# Patient Record
Sex: Female | Born: 1960
Health system: Southern US, Community
[De-identification: ages and names within clinical notes are randomized; demographics above are authoritative.]

## PROBLEM LIST (undated history)

## (undated) DIAGNOSIS — R87613 High grade squamous intraepithelial lesion on cytologic smear of cervix (HGSIL): Secondary | ICD-10-CM

## (undated) DIAGNOSIS — I38 Endocarditis, valve unspecified: Secondary | ICD-10-CM

## (undated) DIAGNOSIS — E0500 Thyrotoxicosis with diffuse goiter without thyrotoxic crisis or storm: Secondary | ICD-10-CM

## (undated) DIAGNOSIS — E039 Hypothyroidism, unspecified: Secondary | ICD-10-CM

## (undated) DIAGNOSIS — R079 Chest pain, unspecified: Secondary | ICD-10-CM

## (undated) DIAGNOSIS — F17200 Nicotine dependence, unspecified, uncomplicated: Secondary | ICD-10-CM

## (undated) DIAGNOSIS — R87629 Unspecified abnormal cytological findings in specimens from vagina: Secondary | ICD-10-CM

## (undated) DIAGNOSIS — N871 Moderate cervical dysplasia: Secondary | ICD-10-CM

## (undated) DIAGNOSIS — D069 Carcinoma in situ of cervix, unspecified: Secondary | ICD-10-CM

## (undated) HISTORY — PX: BACK SURGERY: SHX140

## (undated) HISTORY — PX: COLONOSCOPY: SHX174

## (undated) HISTORY — DX: Unspecified abnormal cytological findings in specimens from vagina: R87.629

## (undated) HISTORY — PX: OTHER SURGICAL HISTORY: SHX169

## (undated) HISTORY — PX: BREAST SURGERY: SHX581

## (undated) HISTORY — DX: Hypothyroidism, unspecified: E03.9

## (undated) HISTORY — PX: ESOPHAGOGASTRODUODENOSCOPY: SHX1529

## (undated) HISTORY — DX: Chest pain, unspecified: R07.9

---
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
## 2000-12-27 ENCOUNTER — Ambulatory Visit (HOSPITAL_COMMUNITY): Admission: RE | Admit: 2000-12-27 | Discharge: 2000-12-27 | Payer: Self-pay | Admitting: Family Medicine

## 2000-12-27 ENCOUNTER — Encounter: Payer: Self-pay | Admitting: Family Medicine

## 2000-12-28 ENCOUNTER — Encounter: Payer: Self-pay | Admitting: Family Medicine

## 2000-12-28 ENCOUNTER — Ambulatory Visit (HOSPITAL_COMMUNITY): Admission: RE | Admit: 2000-12-28 | Discharge: 2000-12-28 | Payer: Self-pay | Admitting: Family Medicine

## 2000-12-31 ENCOUNTER — Ambulatory Visit (HOSPITAL_COMMUNITY): Admission: RE | Admit: 2000-12-31 | Discharge: 2000-12-31 | Payer: Self-pay | Admitting: *Deleted

## 2000-12-31 HISTORY — PX: CARDIAC CATHETERIZATION: SHX172

## 2001-09-07 ENCOUNTER — Other Ambulatory Visit: Admission: RE | Admit: 2001-09-07 | Discharge: 2001-09-07 | Payer: Self-pay | Admitting: Unknown Physician Specialty

## 2001-09-08 ENCOUNTER — Ambulatory Visit (HOSPITAL_COMMUNITY): Admission: RE | Admit: 2001-09-08 | Discharge: 2001-09-08 | Payer: Self-pay | Admitting: Family Medicine

## 2001-09-08 ENCOUNTER — Encounter: Payer: Self-pay | Admitting: Family Medicine

## 2001-09-22 ENCOUNTER — Ambulatory Visit (HOSPITAL_COMMUNITY): Admission: RE | Admit: 2001-09-22 | Discharge: 2001-09-22 | Payer: Self-pay | Admitting: Neurosurgery

## 2001-09-22 ENCOUNTER — Encounter: Payer: Self-pay | Admitting: Neurosurgery

## 2003-05-27 ENCOUNTER — Emergency Department (HOSPITAL_COMMUNITY): Admission: EM | Admit: 2003-05-27 | Discharge: 2003-05-28 | Payer: Self-pay | Admitting: Emergency Medicine

## 2003-08-08 ENCOUNTER — Ambulatory Visit (HOSPITAL_COMMUNITY): Admission: RE | Admit: 2003-08-08 | Discharge: 2003-08-08 | Payer: Self-pay | Admitting: Family Medicine

## 2003-09-07 ENCOUNTER — Encounter: Admission: RE | Admit: 2003-09-07 | Discharge: 2003-09-07 | Payer: Self-pay | Admitting: Neurosurgery

## 2004-01-18 ENCOUNTER — Ambulatory Visit (HOSPITAL_COMMUNITY): Admission: RE | Admit: 2004-01-18 | Discharge: 2004-01-18 | Payer: Self-pay | Admitting: Family Medicine

## 2004-09-15 ENCOUNTER — Ambulatory Visit (HOSPITAL_COMMUNITY): Admission: RE | Admit: 2004-09-15 | Discharge: 2004-09-15 | Payer: Self-pay | Admitting: Family Medicine

## 2004-10-28 ENCOUNTER — Inpatient Hospital Stay (HOSPITAL_COMMUNITY): Admission: RE | Admit: 2004-10-28 | Discharge: 2004-10-31 | Payer: Self-pay | Admitting: Neurosurgery

## 2005-04-17 ENCOUNTER — Ambulatory Visit: Payer: Self-pay | Admitting: Internal Medicine

## 2005-04-20 ENCOUNTER — Ambulatory Visit (HOSPITAL_COMMUNITY): Admission: RE | Admit: 2005-04-20 | Discharge: 2005-04-20 | Payer: Self-pay | Admitting: Internal Medicine

## 2005-04-20 ENCOUNTER — Ambulatory Visit: Payer: Self-pay | Admitting: Internal Medicine

## 2005-05-06 ENCOUNTER — Ambulatory Visit: Payer: Self-pay | Admitting: Internal Medicine

## 2005-05-06 ENCOUNTER — Ambulatory Visit (HOSPITAL_COMMUNITY): Admission: RE | Admit: 2005-05-06 | Discharge: 2005-05-06 | Payer: Self-pay | Admitting: Internal Medicine

## 2007-03-09 ENCOUNTER — Ambulatory Visit (HOSPITAL_COMMUNITY): Admission: RE | Admit: 2007-03-09 | Discharge: 2007-03-09 | Payer: Self-pay | Admitting: Family Medicine

## 2010-06-22 ENCOUNTER — Encounter: Payer: Self-pay | Admitting: Family Medicine

## 2010-10-17 NOTE — Cardiovascular Report (Signed)
. [facility]  Patient:    Kayla Cervantes, Kayla Cervantes                     MRN: [id number] Proc. Date: 12/31/00 Adm. Date:  12/31/00 Attending:  Sallye Lat, M.D. CC:         Jonell Cluck, M.D.  [facility]   Cardiac Catheterization  PROCEDURES PERFORMED: 1. Selective left ventriculography. 2. Selective right and left coronary arteriography. 3. Right femoral arteriography. 4. Closure of right femoral artery access site using Perclose.  INDICATIONS:  Ms. Ardelle Anton is a 50 year old female with past medical history of hypothyroidism, otherwise healthy.  She has been complaining of chest pain. She has a very strong family history of premature coronary artery disease in that her father had a myocardial infarction at the age of 5 and, also another brother died at a very young age with myocardial infarction.  Hence, she was directly brought to the cardiac catheterization laboratory to evaluate her coronary anatomy.  HEMODYNAMIC DATA: 1. The left ventricular pressures were 101, with an end-diastolic pressure    of 10 mmHg. 2. The central aortic pressures were 103/65, with a mean of 81 mmHg. 3. There is no pressure gradient across the left ventricle to aorta and    across the aortic valve.  LEFT VENTRICULOGRAM:  The left ventricular systolic function is well preserved.  The ejection fraction is estimated at 60%.  CORONARY ARTERIOGRAMS: 1. Left main:  The left main coronary artery is a large caliber vessel.  It    is disease free and bifurcated into the left anterior descending and    circumflex coronary arteries. 2. Left anterior descending:  The left anterior descending was a large caliber    vessel.  It is disease free.  It gives origin to a large diagonal #1 and    smaller diagonal #2 and diagonal #3 vessels. 3. Circumflex artery:  The circumflex artery is a nondominant vessel.  It ends    in the AV groove as a tiny  branch after giving origin to a large obtuse    marginal #1 branch.  They are disease free. 4. Right coronary artery:  The right coronary artery is a dominant vessel.  It    gives origin to PLV and PDA branches.  There is no disease in the right    coronary artery.  RIGHT FEMORAL ARTERIOGRAPHY:  This revealed good arterial access site.  IMPRESSIONS:  Normal coronary arteries.  RECOMMENDATIONS:  Smoking cessation is recommended.  Further workup for non-cardiac cause of chest pain may be indicated.  TECHNIQUE OF PROCEDURE:  Under the usual sterile precautions, using a 6-French right femoral artery access a 6-French multipurpose B2 catheter was advanced into the ascending aorta over a 0.035 mm J-wire.  The right coronary artery was selectively engaged and angiography was performed.  After obtaining adequate views, the catheter was then gently advanced into the left ventricle. Left ventriculography was performed both in LAO and RAO projections.  This was performed after obtaining the hemodynamic monitoring.  Then the catheter was exchanged to a 6-French Judkins left diagnostic catheter.  The left main coronary artery was selectively engaged and arteriography was performed. After obtaining adequate views, the catheter was pulled out of the body in the usual fashion.  Right femoral arteriography was performed in the RAO projection.  The right femoral artery access site was then closed using 6-French Perclose with excellent hemostasis achieved.  The patient  was transferred to the floor in stable condition. DD:  12/31/00 TD:  01/02/01 Job: [id number]

## 2010-10-17 NOTE — Op Note (Signed)
Kayla Cervantes, Kayla Cervantes               ACCOUNT NO.:  [id number]   MEDICAL RECORD NO.:  [id number]          PATIENT TYPE:  AMB   LOCATION:  DAY                           FACILITY:  [facility]   PHYSICIAN:  R. Roetta Sessions, M.D. DATE OF BIRTH:  07-02-1960   DATE OF PROCEDURE:  05/06/2005  DATE OF DISCHARGE:                                 OPERATIVE REPORT   PROCEDURE:  Diagnostic colonoscopy.   INDICATIONS FOR PROCEDURE:  A 50 year old lady with iron-deficiency anemia  and chronic diarrhea, abdominal pain. Colonoscopy is now being done to  further evaluate her symptoms. This approach has been discussed with the  patient at length. Potential risks, benefits, and alternatives have been  reviewed and questions answered.   PROCEDURE NOTE:  O2 saturation, blood pressure, pulse, and respirations were  monitored throughout the entire procedure. Conscious sedation with IV Versed  and Demerol in incremental doses.   INSTRUMENT:  Olympus video chip system.   FINDINGS:  Digital rectal exam revealed no abnormalities.   ENDOSCOPIC FINDINGS:  Prep was good.   Rectum:  Examination of the rectal mucosa including retroflexed view of the  anal verge revealed no abnormalities.   Colon:  Colonic mucosa was surveyed from the rectosigmoid junction through  the left, transverse, and right colon to the area of the appendiceal  orifice, ileocecal valve, and cecum. These structures were well seen and  photographed for the record. Terminal ileum was intubated to 10 cm. From  this level, the scope was slowly withdrawn, and all previously mentioned  mucosal surfaces were again seen. The colonic mucosa appeared normal as did  the terminal ileal mucosa. Stool residue was suctioned for microbiology  studies. The patient tolerated the procedure well and was reactive to  endoscopy.   IMPRESSION:  Normal rectum, colon and terminal ileum. Stool sample obtained.   RECOMMENDATIONS:  Follow up on stool studies. Further  recommendations to  follow.      Jonathon Bellows, M.D.  Electronically Signed     RMR/MEDQ  D:  05/06/2005  T:  05/06/2005  Job:  [id number]   cc:   Patrica Duel, M.D.  Fax: [phone]

## 2010-10-17 NOTE — Discharge Summary (Signed)
NAMEAYANO, DOUTHITT NO.:  [id number]   MEDICAL RECORD NO.:  [id number]          PATIENT TYPE:  INP   LOCATION:  3035                         FACILITY:  [facility]   PHYSICIAN:  Hilda Lias, M.D.   DATE OF BIRTH:  1961/04/14   DATE OF ADMISSION:  10/28/2004  DATE OF DISCHARGE:  10/31/2004                                 DISCHARGE SUMMARY   ADMITTING DIAGNOSES:  L5-S1 degenerative disk disease with a chronic  radiculopathy.   POSTOPERATIVE DIAGNOSES:  L5-S1 degenerative disk disease with a chronic  radiculopathy.   CLINICAL HISTORY:  The patient was admitted because of back pain with  radiation to both legs.  X-rays show severe case of degenerative disk  disease.  Patient wanted to proceed with surgery.  Laboratory normal.   HOSPITAL COURSE:  Patient was taken to surgery and L5-S1 diskectomy with  interbody fusion and pedicle screw was done.  Patient today is ambulating,  although she is complaining of incisional pain.  Nevertheless, the weakness  is gone and the pain has improved.  She is ready to go home to be followed  by [place] in the office.   CONDITION ON DISCHARGE:  Improving.   MEDICATIONS:  Percocet, diazepam, Neurontin.   DIET:  Regular.   ACTIVITY:  Not to drive for at least four weeks.   FOLLOW-UP:  She has an appointment to see me in four weeks.      EB/MEDQ  D:  10/31/2004  T:  10/31/2004  Job:  [id number]

## 2010-10-17 NOTE — Op Note (Signed)
Kayla Cervantes, ISLAND NO.:  [id number]   MEDICAL RECORD NO.:  [id number]          PATIENT TYPE:  INP   LOCATION:  2899                         FACILITY:  [facility]   PHYSICIAN:  Hilda Lias, M.D.   DATE OF BIRTH:  [birth year]   DATE OF PROCEDURE:  10/28/2004  DATE OF DISCHARGE:                                 OPERATIVE REPORT   PREOPERATIVE DIAGNOSES:  Degenerative disk disease L5-S1 with a chronic S1  radiculopathy.   POSTOPERATIVE DIAGNOSES:  Degenerative disk disease L5-S1 with a chronic S1  radiculopathy.   PROCEDURE:  Bilateral L5 laminectomy and facetectomy, pedicle screws L5 to  S1, posterolateral arthrodesis, interbody fusion with autograft and BMP. C-  arm.   SURGEON:  Hilda Lias, M.D.   ASSISTANT:  Dalia Heading, M.D.   HISTORY:  The patient is admitted because of bilateral leg pain, left worse  than the right one. The patient had failed conservative therapy. X-rays show  that she has a severe case of degenerative disk disease at the level of L5-  S1. The patient wanted to proceed with surgery and the risk was explained in  the history and physical.   DESCRIPTION OF PROCEDURE:  The patient was taken to the OR she was  positioned in prone manner. The back was prepped with Betadine. A midline  incision from L5 down to S1 was made and muscles were retracted laterally.  By x-ray we identified L5-S1 interspace. We removed the spinous process and  followed the lamina and the facet of L5. The patient had quite a bit of scar  tissue mostly in the left side compromising the L5 nerve root. After lysis  was accomplished, we tried to enter the interspace but it was difficult to  do a diskectomy. The patient has __________. We were able to remove enough  bone. We were unable to put any interbody fusion with allograft but we  decided to go ahead and use the patient's own bone mixed with BMP. Both were  introduced in the disk space anterior.  From then on  with the C-arm, we  identified the pedicle of L5-S1, pedicle probe was inserted.  AP and lateral  showed good position of the pedicle screws. The nerve was completely free.  Then a __________  from the L5 to S1 pedicle screw bilaterally and was fixed  in place with caps.  The cross link from the left to the right side of the  __________. With the drill moving laterally, we drilled the lateral aspect  of the facet as well as the transverse process of L5 and the ala of the  second. A mix of autograft and BMP was used to fill out the space.  From  then on, the area was irrigated and there was no evidence of CSF once we did  the Valsalva maneuver.  Fentanyl was left in the dural space and the wound  was closed with Vicryl in a Steri-Strip.      EB/MEDQ  D:  10/28/2004  T:  10/28/2004  Job:  [id number]

## 2011-04-08 ENCOUNTER — Other Ambulatory Visit (HOSPITAL_COMMUNITY): Payer: Self-pay | Admitting: Internal Medicine

## 2011-04-08 DIAGNOSIS — Z139 Encounter for screening, unspecified: Secondary | ICD-10-CM

## 2011-04-13 ENCOUNTER — Ambulatory Visit (HOSPITAL_COMMUNITY)
Admission: RE | Admit: 2011-04-13 | Discharge: 2011-04-13 | Disposition: A | Discharge status: Home / Self Care | Payer: BC Managed Care – PPO | Source: Ambulatory Visit | Attending: Internal Medicine | Admitting: Internal Medicine

## 2011-04-13 DIAGNOSIS — Z1231 Encounter for screening mammogram for malignant neoplasm of breast: Secondary | ICD-10-CM | POA: Insufficient documentation

## 2011-04-13 DIAGNOSIS — Z139 Encounter for screening, unspecified: Secondary | ICD-10-CM

## 2011-08-19 ENCOUNTER — Other Ambulatory Visit (HOSPITAL_COMMUNITY): Payer: Self-pay | Admitting: Physician Assistant

## 2011-08-19 DIAGNOSIS — R1013 Epigastric pain: Secondary | ICD-10-CM

## 2011-08-19 DIAGNOSIS — R079 Chest pain, unspecified: Secondary | ICD-10-CM

## 2011-08-19 DIAGNOSIS — E039 Hypothyroidism, unspecified: Secondary | ICD-10-CM

## 2011-08-20 ENCOUNTER — Ambulatory Visit (HOSPITAL_COMMUNITY)
Admission: RE | Admit: 2011-08-20 | Discharge: 2011-08-20 | Disposition: A | Discharge status: Home / Self Care | Payer: BC Managed Care – PPO | Source: Ambulatory Visit | Attending: Physician Assistant | Admitting: Physician Assistant

## 2011-08-20 DIAGNOSIS — R1013 Epigastric pain: Secondary | ICD-10-CM

## 2011-08-20 DIAGNOSIS — R109 Unspecified abdominal pain: Secondary | ICD-10-CM | POA: Insufficient documentation

## 2011-08-20 DIAGNOSIS — E039 Hypothyroidism, unspecified: Secondary | ICD-10-CM

## 2011-08-20 DIAGNOSIS — R079 Chest pain, unspecified: Secondary | ICD-10-CM | POA: Insufficient documentation

## 2011-09-22 DIAGNOSIS — R079 Chest pain, unspecified: Secondary | ICD-10-CM

## 2011-09-22 HISTORY — DX: Chest pain, unspecified: R07.9

## 2012-08-22 ENCOUNTER — Encounter: Payer: Self-pay | Admitting: *Deleted

## 2013-09-17 ENCOUNTER — Emergency Department (HOSPITAL_COMMUNITY)
Admission: EM | Admit: 2013-09-17 | Discharge: 2013-09-17 | Disposition: A | Discharge status: Home / Self Care | Payer: BC Managed Care – PPO | Attending: Emergency Medicine | Admitting: Emergency Medicine

## 2013-09-17 ENCOUNTER — Encounter (HOSPITAL_COMMUNITY): Payer: Self-pay | Admitting: Emergency Medicine

## 2013-09-17 ENCOUNTER — Emergency Department (HOSPITAL_COMMUNITY): Payer: BC Managed Care – PPO

## 2013-09-17 DIAGNOSIS — R42 Dizziness and giddiness: Secondary | ICD-10-CM | POA: Insufficient documentation

## 2013-09-17 DIAGNOSIS — M5416 Radiculopathy, lumbar region: Secondary | ICD-10-CM

## 2013-09-17 DIAGNOSIS — Z8679 Personal history of other diseases of the circulatory system: Secondary | ICD-10-CM | POA: Insufficient documentation

## 2013-09-17 DIAGNOSIS — Z9889 Other specified postprocedural states: Secondary | ICD-10-CM | POA: Insufficient documentation

## 2013-09-17 DIAGNOSIS — E039 Hypothyroidism, unspecified: Secondary | ICD-10-CM | POA: Insufficient documentation

## 2013-09-17 DIAGNOSIS — IMO0002 Reserved for concepts with insufficient information to code with codable children: Secondary | ICD-10-CM | POA: Insufficient documentation

## 2013-09-17 DIAGNOSIS — F17200 Nicotine dependence, unspecified, uncomplicated: Secondary | ICD-10-CM | POA: Insufficient documentation

## 2013-09-17 HISTORY — DX: Endocarditis, valve unspecified: I38

## 2013-09-17 MED ORDER — HYDROCODONE-ACETAMINOPHEN 5-325 MG PO TABS: 1.0000 | ORAL_TABLET | ORAL | Status: DC | PRN

## 2013-09-17 MED ORDER — HYDROCODONE-ACETAMINOPHEN 5-325 MG PO TABS
2.0000 | ORAL_TABLET | Freq: Once | ORAL | Status: AC
  Administered 2013-09-17: 2 via ORAL
  Filled 2013-09-17: qty 2

## 2013-09-17 MED ORDER — CYCLOBENZAPRINE HCL 5 MG PO TABS: 5.0000 mg | ORAL_TABLET | Freq: Three times a day (TID) | ORAL | Status: DC | PRN

## 2013-09-17 NOTE — Discharge Instructions (Signed)
Lumbosacral Radiculopathy °Lumbosacral radiculopathy is a pinched nerve or nerves in the low back (lumbosacral area). When this happens you may have weakness in your legs and may not be able to stand on your toes. You may have pain going down into your legs. There may be difficulties with walking normally. There are many causes of this problem. Sometimes this may happen from an injury, or simply from arthritis or boney problems. It may also be caused by other illnesses such as diabetes. If there is no improvement after treatment, further studies may be done to find the exact cause. °DIAGNOSIS  °X-rays may be needed if the problems become long standing. Electromyograms may be done. This study is one in which the working of nerves and muscles is studied. °HOME CARE INSTRUCTIONS  °· Applications of ice packs may be helpful. Ice can be used in a plastic bag with a towel around it to prevent frostbite to skin. This may be used every 2 hours for 20 to 30 minutes, or as needed, while awake, or as directed by your caregiver. °· Only take over-the-counter or prescription medicines for pain, discomfort, or fever as directed by your caregiver. °· If physical therapy was prescribed, follow your caregiver's directions. °SEEK IMMEDIATE MEDICAL CARE IF:  °· You have pain not controlled with medications. °· You seem to be getting worse rather than better. °· You develop increasing weakness in your legs. °· You develop loss of bowel or bladder control. °· You have difficulty with walking or balance, or develop clumsiness in the use of your legs. °· You have a fever. °MAKE SURE YOU:  °· Understand these instructions. °· Will watch your condition. °· Will get help right away if you are not doing well or get worse. °Document Released: 05/18/2005 Document Revised: 08/10/2011 Document Reviewed: 01/06/2008 °Exit[employer]® Patient Information ©2014 [employer]. ° ° ° Use the the other medicines as directed.  Do not drive within 4 hours of  taking hydrocodone as this will make you drowsy.  Avoid lifting,  Bending,  Twisting or any other activity that worsens your pain over the next week.  Apply an  icepack  to your lower back for 10-15 minutes every 2 hours for the next 2 days.  You should get rechecked if your symptoms are not better over the next 5 days,  Or you develop increased pain,  Weakness in your leg(s) or loss of bladder or bowel function - these are symptoms of a worse injury. ° ° ° °

## 2013-09-17 NOTE — ED Notes (Signed)
Patient c/o lower back pain that started 2 weeks ago. Per patient progressively getting worse. Patient denies any known injury. Per patient has had back surgery in past but has not had any chronic back pain. Per patient pain worse with BM or cough. Patient does report increase in frequency of urination. Denies any pain with urination. Patient reports numbness and tingling radiating down left leg.

## 2013-09-17 NOTE — ED Provider Notes (Signed)
CSN: [id number]     Arrival date & time 09/17/13  1517 History   First MD Initiated Contact with Patient 09/17/13 1555     This chart was scribed for non-physician practitioner, Burgess AmorJulie Jessey Stehlin PA-C working with Hurman HornJohn M Bednar, MD by Arlan OrganAshley Leger, ED Scribe. This patient was seen in room APFT23/APFT23 and the patient's care was started at 4:08 PM.   Chief Complaint  Patient presents with  . Back Pain   The history is provided by the patient. No language interpreter was used.    HPI Comments: Kayla Cervantes is a 53 y.o. female with a PMHx of hypothyroidism who presents to the Emergency Department complaining of lower back pain x 2 weeks that is progressively worsening. She describes this pain as throbbing and is exacerbated after sitting for long periods of time and at night time. She also reports intermittent numbness but not weakness to the L lower extremity. Denies any known injury, however, pt states she noted the pain started shortly after she was mowing the grass during which the mower tipped up while on an incline, and she kicked out her left leg to prevent it from tipping further. She admits to previous back surgery 8 years, however, she has not noted any back pain since onset of surgery. She has tried 200 mg Ibuprofen without any noticeable improvement. At this time she denies any fever or chills. No loss of bowel/bladder function or saddle anesthesia. She is most concerned that she has damaged the hardware in her back during her lawn mower incident.   Past Medical History  Diagnosis Date  . Chest pain 09/22/2011    2D Echo EF>55%  . Hypothyroidism   . Heart valve problem    Past Surgical History  Procedure Laterality Date  . Cardiac catheterization  12/31/2000    for chest pain EF 60%  . Breast surgery     Family History  Problem Relation Age of Onset  . Heart disease Mother 8280    [facility]  . Heart attack Father 8238    deceased  . Stroke Maternal Grandmother      deceased, heart failure  . Diabetes Paternal Grandmother [id number]    deceased  . Cancer Paternal Grandfather 3080    deceased  . Hypertension Brother   . Thyroid disease Brother 819    deceased bleeding at surgery   History  Substance Use Topics  . Smoking status: Current Every Day Smoker -- 1.00 packs/day for 35 years    Types: Cigarettes  . Smokeless tobacco: Never Used  . Alcohol Use: No   OB History   Grav Para Term Preterm Abortions TAB SAB Ect Mult Living   4 4 2 2      4      Review of Systems  Constitutional: Negative for fever and chills.  HENT: Negative for congestion.   Eyes: Negative for redness.  Respiratory: Negative for cough.   Musculoskeletal: Positive for arthralgias and back pain.  Skin: Negative for rash.  Neurological: Positive for dizziness.  Psychiatric/Behavioral: Negative for confusion.      Allergies  Review of patient's allergies indicates no known allergies.  Home Medications   Prior to Admission medications   Medication Sig Start Date End Date Taking? Authorizing Provider  levothyroxine (SYNTHROID, LEVOTHROID) 100 MCG tablet Take 100 mcg by mouth daily.    Historical Provider, MD   Triage Vitals: BP 129/87  Pulse 76  Temp(Src) 97.6 F (36.4 C) (Oral)  Resp 20  Ht 5\' 4"  (1.626 m)  Wt 140 lb (63.504 kg)  BMI 24.02 kg/m2  SpO2 100%   Physical Exam  Constitutional: She appears well-developed and well-nourished.  HENT:  Head: Atraumatic.  Neck: Normal range of motion.  Cardiovascular:  Pulses equal bilaterally  Musculoskeletal: She exhibits tenderness.  Positive R straight leg raise  Tenderness to palpation over L lumbar spine without deformity or muscle spasm No midline pain Well healed midline surgical incision  Neurological: She is alert. She has normal strength. She displays normal reflexes. No sensory deficit. Gait normal.  Reflex Scores:      Patellar reflexes are 2+ on the right side and 2+ on the left side.      Achilles  reflexes are 2+ on the right side and 2+ on the left side. Normal sensation to fine touch bilaterally lower extremities  Skin: Skin is warm and dry.  Psychiatric: She has a normal mood and affect.    ED Course  Procedures (including critical care time)  DIAGNOSTIC STUDIES: Oxygen Saturation is 100% on RA, Normal by my interpretation.    COORDINATION OF CARE: 4:21 PM- Will order DG Lumbar Spine complete. Discussed treatment plan with pt at bedside and pt agreed to plan.     Labs Review Labs Reviewed - No data to display  Imaging Review Dg Lumbar Spine Complete  09/17/2013   CLINICAL DATA:  Low back injury 2 weeks ago. Low back pain, left side greater than right.  EXAM: LUMBAR SPINE - COMPLETE 4+ VIEW  COMPARISON:  None.  FINDINGS: There is no evidence of lumbar spine fracture. Alignment is normal. Intervertebral disc spaces are maintained  Posterior fixation rods and pedicle screws are seen at L5-S1. Moderate degenerative disc disease noted at L4-5. Other intervertebral disc spaces are maintained. No other significant bone abnormality identified.  IMPRESSION: No acute findings.  Moderate L4-5 degenerative disc disease. Previous lumbar spine fusion at L5-S1.   Electronically Signed   By: Myles RosenthalJohn  Stahl M.D.   On: 09/17/2013 16:44     EKG Interpretation None      MDM   Final diagnoses:  Lumbar radiculopathy, acute    Patients labs and/or radiological studies were viewed and considered during the medical decision making and disposition process. No neuro deficit on exam or by history to suggest emergent or surgical presentation.  Also discussed worsened sx that should prompt immediate re-evaluation including distal weakness, bowel/bladder retention/incontinence.  She was prescribed flexeril, hydrocodone,  Encouraged continued ibuprofen,  Heat tx.  F/u with pcp if sx are not improving over the next week.    I personally performed the services described in this documentation, which was  scribed in my presence. The recorded information has been reviewed and is accurate.    Burgess AmorJulie Rilyn Upshaw, PA-C 09/18/13 1844

## 2013-09-18 NOTE — ED Provider Notes (Signed)
Medical screening examination/treatment/procedure(s) were performed by non-physician practitioner and as supervising physician I was immediately available for consultation/collaboration.   EKG Interpretation None       Ilija Maxim M Ayven Pheasant, MD 09/18/13 2205 

## 2014-04-02 ENCOUNTER — Encounter (HOSPITAL_COMMUNITY): Payer: Self-pay | Admitting: Emergency Medicine

## 2014-04-17 ENCOUNTER — Other Ambulatory Visit (HOSPITAL_COMMUNITY): Payer: Self-pay | Admitting: Internal Medicine

## 2014-04-17 DIAGNOSIS — Z1231 Encounter for screening mammogram for malignant neoplasm of breast: Secondary | ICD-10-CM

## 2014-04-23 ENCOUNTER — Ambulatory Visit (HOSPITAL_COMMUNITY)
Admission: RE | Admit: 2014-04-23 | Discharge: 2014-04-23 | Disposition: A | Discharge status: Home / Self Care | Payer: BC Managed Care – PPO | Source: Ambulatory Visit | Attending: Internal Medicine | Admitting: Internal Medicine

## 2014-04-23 DIAGNOSIS — Z1231 Encounter for screening mammogram for malignant neoplasm of breast: Secondary | ICD-10-CM | POA: Insufficient documentation

## 2015-01-13 ENCOUNTER — Observation Stay (HOSPITAL_COMMUNITY)
Admission: EM | Admit: 2015-01-13 | Discharge: 2015-01-15 | Disposition: A | Discharge status: Home / Self Care | Payer: BLUE CROSS/BLUE SHIELD | Attending: Internal Medicine | Admitting: Internal Medicine

## 2015-01-13 ENCOUNTER — Encounter (HOSPITAL_COMMUNITY): Payer: Self-pay | Admitting: Emergency Medicine

## 2015-01-13 ENCOUNTER — Emergency Department (HOSPITAL_COMMUNITY): Payer: BLUE CROSS/BLUE SHIELD

## 2015-01-13 DIAGNOSIS — F17200 Nicotine dependence, unspecified, uncomplicated: Secondary | ICD-10-CM | POA: Diagnosis present

## 2015-01-13 DIAGNOSIS — I38 Endocarditis, valve unspecified: Secondary | ICD-10-CM | POA: Insufficient documentation

## 2015-01-13 DIAGNOSIS — Z720 Tobacco use: Secondary | ICD-10-CM | POA: Diagnosis not present

## 2015-01-13 DIAGNOSIS — R079 Chest pain, unspecified: Secondary | ICD-10-CM | POA: Diagnosis present

## 2015-01-13 DIAGNOSIS — E039 Hypothyroidism, unspecified: Secondary | ICD-10-CM | POA: Diagnosis not present

## 2015-01-13 DIAGNOSIS — R42 Dizziness and giddiness: Secondary | ICD-10-CM | POA: Diagnosis not present

## 2015-01-13 DIAGNOSIS — R61 Generalized hyperhidrosis: Secondary | ICD-10-CM | POA: Diagnosis not present

## 2015-01-13 DIAGNOSIS — R0789 Other chest pain: Secondary | ICD-10-CM | POA: Diagnosis not present

## 2015-01-13 DIAGNOSIS — E0500 Thyrotoxicosis with diffuse goiter without thyrotoxic crisis or storm: Secondary | ICD-10-CM | POA: Diagnosis not present

## 2015-01-13 HISTORY — DX: Thyrotoxicosis with diffuse goiter without thyrotoxic crisis or storm: E05.00

## 2015-01-13 LAB — CBC
HEMATOCRIT: 38.4 % (ref 36.0–46.0)
Hemoglobin: 13 g/dL (ref 12.0–15.0)
MCH: 31.1 pg (ref 26.0–34.0)
MCHC: 33.9 g/dL (ref 30.0–36.0)
MCV: 91.9 fL (ref 78.0–100.0)
PLATELETS: 229 10*3/uL (ref 150–400)
RBC: 4.18 MIL/uL (ref 3.87–5.11)
RDW: 13.2 % (ref 11.5–15.5)
WBC: 6.2 10*3/uL (ref 4.0–10.5)

## 2015-01-13 LAB — BASIC METABOLIC PANEL
Anion gap: 8 (ref 5–15)
BUN: 19 mg/dL (ref 6–20)
CALCIUM: 8.8 mg/dL — AB (ref 8.9–10.3)
CO2: 26 mmol/L (ref 22–32)
Chloride: 104 mmol/L (ref 101–111)
Creatinine, Ser: 0.75 mg/dL (ref 0.44–1.00)
GFR calc Af Amer: >60 mL/min (ref >60)
GFR calc non Af Amer: >60 mL/min (ref >60)
GLUCOSE: 104 mg/dL — AB (ref 65–99)
Potassium: 4 mmol/L (ref 3.5–5.1)
Sodium: 138 mmol/L (ref 135–145)

## 2015-01-13 LAB — I-STAT TROPONIN, ED: Troponin i, poc: 0 ng/mL (ref 0.00–0.08)

## 2015-01-13 LAB — D-DIMER, QUANTITATIVE: D-Dimer, Quant: <0.27 ug/mL-FEU (ref 0.00–0.48)

## 2015-01-13 MED ORDER — ASPIRIN 81 MG PO CHEW
324.0000 mg | CHEWABLE_TABLET | Freq: Once | ORAL | Status: AC
  Administered 2015-01-13: 324 mg via ORAL
  Filled 2015-01-13: qty 4

## 2015-01-13 MED ORDER — MORPHINE SULFATE 4 MG/ML IJ SOLN
6.0000 mg | Freq: Once | INTRAMUSCULAR | Status: AC
  Administered 2015-01-13: 6 mg via INTRAVENOUS
  Filled 2015-01-13: qty 2

## 2015-01-13 NOTE — ED Notes (Signed)
Pt. Reports chest pain at 10 am this morning. Pt. Reports sharp chest pain with dizziness, nausea, diaphoresis lasting 15 minutes. Pt. Reports that she is prescribed nitroglycerin but did not take any. Pt. Reports that she has felt like she has been in a daze since the episode.

## 2015-01-13 NOTE — ED Notes (Signed)
Patient states that her chest is still hurting and her head hurts but not as bad.

## 2015-01-13 NOTE — ED Provider Notes (Signed)
CSN: [id number]     Arrival date & time 01/13/15  2026 History   First MD Initiated Contact with Patient 01/13/15 2036     Chief Complaint  Patient presents with  . Chest Pain     (Consider location/radiation/quality/duration/timing/severity/associated sxs/prior Treatment) HPI Comments: 54 year old female with smoking history cholesterol and family history of cardiac presents with chest pressure. Patient significant chest pressure diaphoresis and nausea this morning at 10 improved significantly however patient still is very mild pressure. Radiation to the jaw. Patient has had brief episode the past but never this severe prolonged period no classic blood clot risk factors however patient does have pleuritic component. Symptoms improved with time. Patient has stress test 3 years ago with no signs of ischemia.  Patient is a 54 y.o. female presenting with chest pain. The history is provided by the patient.  Chest Pain Associated symptoms: diaphoresis   Associated symptoms: no abdominal pain, no back pain, no fever, no headache, no shortness of breath and not vomiting     Past Medical History  Diagnosis Date  . Chest pain 09/22/2011    2D Echo EF>55%  . Hypothyroidism   . Heart valve problem   . Graves disease    Past Surgical History  Procedure Laterality Date  . Cardiac catheterization  12/31/2000    for chest pain EF 60%  . Breast surgery     Family History  Problem Relation Age of Onset  . Heart disease Mother 55    lung disease ( at [facility])  . Heart attack Father 5    deceased  . Stroke Maternal Grandmother     deceased, heart failure  . Diabetes Paternal Grandmother 12    deceased  . Cancer Paternal Grandfather 65    deceased  . Hypertension Brother   . Thyroid disease Brother 31    deceased bleeding at surgery   Social History  Substance Use Topics  . Smoking status: Current Every Day Smoker -- 1.00 packs/day for 35 years    Types: Cigarettes  . Smokeless  tobacco: Never Used  . Alcohol Use: No   OB History    Gravida Para Term Preterm AB TAB SAB Ectopic Multiple Living   4 4 2 2      4      Review of Systems  Constitutional: Positive for diaphoresis. Negative for fever and chills.  HENT: Negative for congestion.   Eyes: Negative for visual disturbance.  Respiratory: Negative for shortness of breath.   Cardiovascular: Positive for chest pain. Negative for leg swelling.  Gastrointestinal: Negative for vomiting and abdominal pain.  Genitourinary: Negative for dysuria and flank pain.  Musculoskeletal: Negative for back pain, neck pain and neck stiffness.  Skin: Negative for rash.  Neurological: Positive for light-headedness. Negative for headaches.      Allergies  Review of patient's allergies indicates no known allergies.  Home Medications   Prior to Admission medications   Medication Sig Start Date End Date Taking? Authorizing Provider  acetaminophen (TYLENOL) 500 MG tablet Take 1,000 mg by mouth every 6 (six) hours as needed for mild pain.   Yes Historical Provider, MD  levothyroxine (SYNTHROID, LEVOTHROID) 175 MCG tablet Take 175 mcg by mouth daily before breakfast.   Yes Historical Provider, MD  cyclobenzaprine (FLEXERIL) 5 MG tablet Take 1 tablet (5 mg total) by mouth 3 (three) times daily as needed for muscle spasms. 09/17/13   Burgess Amor, PA-C  HYDROcodone-acetaminophen (NORCO/VICODIN) 5-325 MG per tablet Take 1 tablet by  mouth every 4 (four) hours as needed for moderate pain. 09/17/13   Burgess Amor, PA-C   BP 118/72 mmHg  Pulse 74  Temp(Src) 98.1 F (36.7 C) (Oral)  Resp 19  SpO2 96% Physical Exam  Constitutional: She is oriented to person, place, and time. She appears well-developed and well-nourished.  HENT:  Head: Normocephalic and atraumatic.  Eyes: Conjunctivae are normal. Right eye exhibits no discharge. Left eye exhibits no discharge.  Neck: Normal range of motion. Neck supple. No tracheal deviation present.   Cardiovascular: Normal rate, regular rhythm and intact distal pulses.   Pulmonary/Chest: Effort normal and breath sounds normal.  Abdominal: Soft. She exhibits no distension. There is no tenderness. There is no guarding.  Musculoskeletal: She exhibits no edema.  Neurological: She is alert and oriented to person, place, and time.  Skin: Skin is warm. No rash noted.  Psychiatric: She has a normal mood and affect.  Nursing note and vitals reviewed.   ED Course  Procedures (including critical care time) Labs Review Labs Reviewed  BASIC METABOLIC PANEL - Abnormal; Notable for the following:    Glucose, Bld 104 (*)    Calcium 8.8 (*)    All other components within normal limits  CBC  D-DIMER, QUANTITATIVE (NOT AT [facility])  Rosezena Sensor, ED    Imaging Review Dg Chest 2 View  01/13/2015   CLINICAL DATA:  Chest pain  EXAM: CHEST  2 VIEW  COMPARISON:  01/18/2004  FINDINGS: Lateral imaging is limited by rotation.  Normal heart size and mediastinal contours. No acute infiltrate or edema. No effusion or pneumothorax. No acute osseous findings.  IMPRESSION: No active cardiopulmonary disease.   Electronically Signed   By: Marnee Spring M.D.   On: 01/13/2015 21:41   I, Even Budlong M, personally reviewed and evaluated these images and lab results as part of my medical decision-making.   EKG Interpretation None      MDM   Final diagnoses:  Other chest pain   Patient presents after concerning story this morning for possible cardiac. Patient has a heart score of 5. Minimal symptoms currently. With pleuritic component d-dimer added. Dr. Effie Shy will follow-up d-dimer and plan for observation telemetry.  The patients results and plan were reviewed and discussed.   Any x-rays performed were independently reviewed by myself.   Differential diagnosis were considered with the presenting HPI.  Medications  morphine 4 MG/ML injection 6 mg (6 mg Intravenous Given 01/13/15 2127)  aspirin  chewable tablet 324 mg (324 mg Oral Given 01/13/15 2126)    Filed Vitals:   01/13/15 2037 01/13/15 2100 01/13/15 2130 01/13/15 2131  BP: 140/76 120/69 118/72 118/72  Pulse: 80 79 65 74  Temp: 98.1 F (36.7 C)     TempSrc: Oral     Resp: SpO2: 97% 94% 96% 96%    Final diagnoses:  Other chest pain        Blane Ohara, MD 01/13/15 2153

## 2015-01-14 ENCOUNTER — Encounter (HOSPITAL_COMMUNITY): Payer: Self-pay | Admitting: Adult Health

## 2015-01-14 ENCOUNTER — Observation Stay (HOSPITAL_BASED_OUTPATIENT_CLINIC_OR_DEPARTMENT_OTHER): Payer: BLUE CROSS/BLUE SHIELD

## 2015-01-14 DIAGNOSIS — F17200 Nicotine dependence, unspecified, uncomplicated: Secondary | ICD-10-CM | POA: Diagnosis present

## 2015-01-14 DIAGNOSIS — R0789 Other chest pain: Secondary | ICD-10-CM | POA: Diagnosis not present

## 2015-01-14 DIAGNOSIS — E039 Hypothyroidism, unspecified: Secondary | ICD-10-CM

## 2015-01-14 DIAGNOSIS — R079 Chest pain, unspecified: Secondary | ICD-10-CM | POA: Diagnosis not present

## 2015-01-14 DIAGNOSIS — Z720 Tobacco use: Secondary | ICD-10-CM | POA: Diagnosis not present

## 2015-01-14 HISTORY — DX: Nicotine dependence, unspecified, uncomplicated: F17.200

## 2015-01-14 LAB — CBC
HCT: 39.2 % (ref 36.0–46.0)
HCT: 39.7 % (ref 36.0–46.0)
HEMOGLOBIN: 13.2 g/dL (ref 12.0–15.0)
Hemoglobin: 13.1 g/dL (ref 12.0–15.0)
MCH: 30.8 pg (ref 26.0–34.0)
MCH: 30.6 pg (ref 26.0–34.0)
MCHC: 33.4 g/dL (ref 30.0–36.0)
MCHC: 33.2 g/dL (ref 30.0–36.0)
MCV: 92 fL (ref 78.0–100.0)
MCV: 91.9 fL (ref 78.0–100.0)
PLATELETS: 226 10*3/uL (ref 150–400)
PLATELETS: 230 10*3/uL (ref 150–400)
RBC: 4.26 MIL/uL (ref 3.87–5.11)
RBC: 4.32 MIL/uL (ref 3.87–5.11)
RDW: 13.4 % (ref 11.5–15.5)
RDW: 13.3 % (ref 11.5–15.5)
WBC: 6.7 10*3/uL (ref 4.0–10.5)
WBC: 5.2 10*3/uL (ref 4.0–10.5)

## 2015-01-14 LAB — BASIC METABOLIC PANEL
ANION GAP: 5 (ref 5–15)
BUN: 14 mg/dL (ref 6–20)
CALCIUM: 8.9 mg/dL (ref 8.9–10.3)
CO2: 29 mmol/L (ref 22–32)
CREATININE: 0.64 mg/dL (ref 0.44–1.00)
Chloride: 104 mmol/L (ref 101–111)
GFR calc Af Amer: >60 mL/min (ref >60)
GLUCOSE: 87 mg/dL (ref 65–99)
Potassium: 4.5 mmol/L (ref 3.5–5.1)
Sodium: 138 mmol/L (ref 135–145)

## 2015-01-14 LAB — TSH: TSH: 0.026 u[IU]/mL — AB (ref 0.350–4.500)

## 2015-01-14 LAB — I-STAT TROPONIN, ED: Troponin i, poc: 0 ng/mL (ref 0.00–0.08)

## 2015-01-14 LAB — TROPONIN I: Troponin I: <0.03 ng/mL (ref <0.031)

## 2015-01-14 LAB — T4, FREE: FREE T4: 1.18 ng/dL — AB (ref 0.61–1.12)

## 2015-01-14 MED ORDER — ACETAMINOPHEN 325 MG PO TABS
650.0000 mg | ORAL_TABLET | Freq: Four times a day (QID) | ORAL | Status: DC | PRN
  Administered 2015-01-14 (×2): 650 mg via ORAL
  Filled 2015-01-14 (×2): qty 2

## 2015-01-14 MED ORDER — ASPIRIN EC 325 MG PO TBEC
325.0000 mg | DELAYED_RELEASE_TABLET | Freq: Every day | ORAL | Status: DC
  Administered 2015-01-14 – 2015-01-15 (×2): 325 mg via ORAL
  Filled 2015-01-14 (×2): qty 1

## 2015-01-14 MED ORDER — LEVOTHYROXINE SODIUM 75 MCG PO TABS
175.0000 ug | ORAL_TABLET | Freq: Every day | ORAL | Status: DC
  Administered 2015-01-14: 175 ug via ORAL
  Filled 2015-01-14 (×2): qty 1

## 2015-01-14 MED ORDER — SODIUM CHLORIDE 0.9 % IJ SOLN: 3.0000 mL | INTRAMUSCULAR | Status: DC | PRN

## 2015-01-14 MED ORDER — SODIUM CHLORIDE 0.9 % IJ SOLN
3.0000 mL | Freq: Two times a day (BID) | INTRAMUSCULAR | Status: DC
  Administered 2015-01-14 – 2015-01-15 (×2): 3 mL via INTRAVENOUS

## 2015-01-14 MED ORDER — LEVOTHYROXINE SODIUM 75 MCG PO TABS
150.0000 ug | ORAL_TABLET | Freq: Every day | ORAL | Status: DC
  Administered 2015-01-15: 150 ug via ORAL
  Filled 2015-01-14: qty 2

## 2015-01-14 MED ORDER — NITROGLYCERIN 2 % TD OINT
0.5000 [in_us] | TOPICAL_OINTMENT | Freq: Four times a day (QID) | TRANSDERMAL | Status: DC
  Administered 2015-01-14 (×2): 0.5 [in_us] via TOPICAL
  Filled 2015-01-14 (×3): qty 1

## 2015-01-14 MED ORDER — ALUM & MAG HYDROXIDE-SIMETH 200-200-20 MG/5ML PO SUSP: 30.0000 mL | Freq: Four times a day (QID) | ORAL | Status: DC | PRN

## 2015-01-14 MED ORDER — OXYCODONE HCL 5 MG PO TABS: 5.0000 mg | ORAL_TABLET | ORAL | Status: DC | PRN

## 2015-01-14 MED ORDER — ACETAMINOPHEN 650 MG RE SUPP: 650.0000 mg | Freq: Four times a day (QID) | RECTAL | Status: DC | PRN

## 2015-01-14 MED ORDER — SODIUM CHLORIDE 0.9 % IV SOLN: 250.0000 mL | INTRAVENOUS | Status: DC | PRN

## 2015-01-14 MED ORDER — SODIUM CHLORIDE 0.9 % IJ SOLN
3.0000 mL | Freq: Two times a day (BID) | INTRAMUSCULAR | Status: DC
  Administered 2015-01-14 – 2015-01-15 (×4): 3 mL via INTRAVENOUS

## 2015-01-14 MED ORDER — ONDANSETRON HCL 4 MG PO TABS: 4.0000 mg | ORAL_TABLET | Freq: Four times a day (QID) | ORAL | Status: DC | PRN

## 2015-01-14 MED ORDER — ENOXAPARIN SODIUM 40 MG/0.4ML ~~LOC~~ SOLN
40.0000 mg | SUBCUTANEOUS | Status: DC
  Administered 2015-01-14 – 2015-01-15 (×2): 40 mg via SUBCUTANEOUS
  Filled 2015-01-14 (×2): qty 0.4

## 2015-01-14 MED ORDER — HYDROMORPHONE HCL 1 MG/ML IJ SOLN
0.5000 mg | INTRAMUSCULAR | Status: DC | PRN
  Administered 2015-01-14 – 2015-01-15 (×2): 0.5 mg via INTRAVENOUS
  Filled 2015-01-14 (×2): qty 1

## 2015-01-14 MED ORDER — ONDANSETRON HCL 4 MG/2ML IJ SOLN: 4.0000 mg | Freq: Four times a day (QID) | INTRAMUSCULAR | Status: DC | PRN

## 2015-01-14 NOTE — H&P (Signed)
[facility] Admission History and Physical       Kayla Cervantes NWG:[id number] DOB: 07-31-1960 DOA: 01/13/2015  Referring physician: EDP PCP: Cassell Smiles., MD  Specialists:   Chief Complaint: Chest Pain  HPI: Kayla Cervantes is a 54 y.o. female with a history of Graves Disease S/P RAI Rx who presents to the ED with complaints of greater than 10/10 sharp and pressure like chest pain since 10 AM.  She reports having SOB and Diaphoresis along with light headedness associated with her pain.  The pain radiated into her jaw.  She has a Family history of CAD, and is a smoker.  She was evaluated in the ED and her initial troponin was negative.  She was referred for further evaluation.     She reports that she had a stress test 3 years ago, and a 2D ECHO was performed 08/2011.      Review of Systems:  Constitutional: No Weight Loss, No Weight Gain, Night Sweats, Fevers, Chills, Dizziness, +Light Headedness, Fatigue, or Generalized Weakness HEENT: No Headaches, Difficulty Swallowing,Tooth/Dental Problems,Sore Throat,  No Sneezing, Rhinitis, Ear Ache, Nasal Congestion, or Post Nasal Drip,  Cardio-vascular:  +Chest pain, Orthopnea, PND, Edema in Lower Extremities, Anasarca, Dizziness, Palpitations  Resp: +Dyspnea, No DOE, No Productive Cough, No Non-Productive Cough, No Hemoptysis, No Wheezing.    GI: No Heartburn, Indigestion, Abdominal Pain, Nausea, Vomiting, Diarrhea, Constipation, Hematemesis, Hematochezia, Melena, Change in Bowel Habits,  Loss of Appetite  GU: No Dysuria, No Change in Color of Urine, No Urgency or Urinary Frequency, No Flank pain.  Musculoskeletal: No Joint Pain or Swelling, No Decreased Range of Motion, No Back Pain.  Neurologic: No Syncope, No Seizures, Muscle Weakness, Paresthesia, Vision Disturbance or Loss, No Diplopia, No Vertigo, No Difficulty Walking,  Skin: No Rash or Lesions. Psych: No Change in Mood or Affect, No Depression or Anxiety, No Memory loss, No  Confusion, or Hallucinations   Past Medical History  Diagnosis Date  . Chest pain 09/22/2011    2D Echo EF>55%  . Hypothyroidism   . Heart valve problem   . Graves disease      Past Surgical History  Procedure Laterality Date  . Cardiac catheterization  12/31/2000    for chest pain EF 60%  . Breast surgery        Prior to Admission medications   Medication Sig Start Date End Date Taking? Authorizing Provider  acetaminophen (TYLENOL) 500 MG tablet Take 1,000 mg by mouth every 6 (six) hours as needed for mild pain.   Yes Historical Provider, MD  levothyroxine (SYNTHROID, LEVOTHROID) 175 MCG tablet Take 175 mcg by mouth daily before breakfast.   Yes Historical Provider, MD  cyclobenzaprine (FLEXERIL) 5 MG tablet Take 1 tablet (5 mg total) by mouth 3 (three) times daily as needed for muscle spasms. 09/17/13   Burgess Amor, PA-C  HYDROcodone-acetaminophen (NORCO/VICODIN) 5-325 MG per tablet Take 1 tablet by mouth every 4 (four) hours as needed for moderate pain. 09/17/13   Burgess Amor, PA-C     No Known Allergies     Social History:  reports that she has been smoking Cigarettes.  She has a 35 pack-year smoking history. She has never used smokeless tobacco. She reports that she does not drink alcohol or use illicit drugs.     Family History  Problem Relation Age of Onset  . Heart disease Mother 38    lung disease ( at [facility] center)  . Heart attack Father 79    deceased  .  Stroke Maternal Grandmother     deceased, heart failure  . Diabetes Paternal Grandmother 70    deceased  . Cancer Paternal Grandfather 26    deceased  . Hypertension Brother   . Thyroid disease Brother 66    deceased bleeding at surgery       Physical Exam:  GEN:  Pleasant Well Nourished and Well Developed  54 y.o. Caucasian female examined and in no acute distress; cooperative with exam Filed Vitals:   01/14/15 0000 01/14/15 0030 01/14/15 0045 01/14/15 0115  BP: 112/61 121/74    Pulse: 62 64 66 62    Temp:      TempSrc:      Resp: SpO2: 97% 97% 97% 99%   Blood pressure 121/74, pulse 62, temperature 98.1 F (36.7 C), temperature source Oral, resp. rate 22, SpO2 99 %. PSYCH: She is alert and oriented x4; does not appear anxious does not appear depressed; affect is normal HEENT: Normocephalic and Atraumatic, Mucous membranes pink; PERRLA; EOM intact; Fundi:  Benign;  No scleral icterus, Nares: Patent, Oropharynx: Clear, Fair Dentition,    Neck:  FROM, No Cervical Lymphadenopathy nor Thyromegaly or Carotid Bruit; No JVD; Breasts:: Not examined CHEST WALL: No tenderness CHEST: Normal respiration, clear to auscultation bilaterally HEART: Regular rate and rhythm; no murmurs rubs or gallops BACK: No kyphosis or scoliosis; No CVA tenderness ABDOMEN: Positive Bowel Sounds, Soft Non-Tender, No Rebound or Guarding; No Masses, No Organomegaly. Rectal Exam: Not done EXTREMITIES: No Cyanosis, Clubbing, or Edema; No Ulcerations. Genitalia: not examined PULSES: 2+ and symmetric SKIN: Normal hydration no rash or ulceration CNS:  Alert and Oriented x 4, No Focal Deficits Vascular: pulses palpable throughout    Labs on Admission:  Basic Metabolic Panel:  Recent Labs Lab 01/13/15 2045  NA 138  K 4.0  CL 104  CO2 26  GLUCOSE 104*  BUN 19  CREATININE 0.75  CALCIUM 8.8*   Liver Function Tests: No results for input(s): AST, ALT, ALKPHOS, BILITOT, PROT, ALBUMIN in the last 168 hours. No results for input(s): LIPASE, AMYLASE in the last 168 hours. No results for input(s): AMMONIA in the last 168 hours. CBC:  Recent Labs Lab 01/13/15 2045  WBC 6.2  HGB 13.0  HCT 38.4  MCV 91.9  PLT 229   Cardiac Enzymes: No results for input(s): CKTOTAL, CKMB, CKMBINDEX, TROPONINI in the last 168 hours.  BNP (last 3 results) No results for input(s): BNP in the last 8760 hours.  ProBNP (last 3 results) No results for input(s): PROBNP in the last 8760 hours.  CBG: No results for  input(s): GLUCAP in the last 168 hours.  Radiological Exams on Admission: Dg Chest 2 View  01/13/2015   CLINICAL DATA:  Chest pain  EXAM: CHEST  2 VIEW  COMPARISON:  01/18/2004  FINDINGS: Lateral imaging is limited by rotation.  Normal heart size and mediastinal contours. No acute infiltrate or edema. No effusion or pneumothorax. No acute osseous findings.  IMPRESSION: No active cardiopulmonary disease.   Electronically Signed   By: Marnee Spring M.D.   On: 01/13/2015 21:41     EKG: Independently reviewed. Normal Sinus Rhythm Rate = 78 No Acute S-T changes   Assessment/Plan:   54 y.o. female with  Active Problems:   1.    Chest pain   Cardiac Monitoring   Cycle Troponins   Nitropaste, O2, ASA   Check Fasting Lipids   2D ECHO in AM     2.  Hypothyroid   Continue Levothyroxine Rx     3.    Tobacco Use Disorder   Counseled Regarding Smoking Cessation   Does not wish to have a Nicotine Patch at this time     4.    DVT Prophylaxis   Lovenox     Code Status:     FULL CODE        Family Communication:   Husband at Bedside       Disposition Plan:   Observation Status        Time spent:  76 Minutes      Ron Parker [facility] Pager [phone]   If 7AM -7PM Please Contact the Day Rounding Team MD for [facility]  If 7PM-7AM, Please Contact Night-Floor Coverage  [email or web address] Password [email or web address] 01/14/2015, 1:35 AM     ADDENDUM:   Patient was seen and examined on 01/14/2015

## 2015-01-14 NOTE — Consult Note (Signed)
CARDIOLOGY CONSULT NOTE   Patient ID: Kayla Cervantes MRN: [id number] DOB/AGE: March 19, 1961 54 y.o.  Admit Date: 01/13/2015 Referring Physician: PTH-Memon Primary Physician: Cassell Smiles., MD Consulting Cardiologist: Dina Rich MD Primary Cardiologist: Malen Gauze  (Formerly Alanda Amass).   Reason for Consultation: Chest Pain  Clinical Summary Kayla Cervantes is a 54 y.o.female with known history of hypothyroidism, with recurrent chest pain. Last NM MPI was in 2013 and was found to be low risk. She has had intermittent sharp chest pain about twice a year, usually with near syncope, or being awake and aware,but not being able to speak or respond to questions. She states she doesn't worry about it because it goes away.  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   She goes on to say that she could not speak and felt as if she were going to pass out. She was awake, alert, and could see others speaking to her, but could not respond. She states she was becoming diaphoretic and was given a cold cloth. She also states that she had a headache, but it had been going on since the day before. She came back to herself in about 15 minutes and continued to work. She was asked to go home after her boss arrived as other co-workers reported the incident. .  After arriving  home, she stated that she just didn't feel well, weak, tired. She had an episode of abdominal pain, nausea,  and loose stool, but not true diarrhea. Abdominal pain improved after defecation. She waited until around 7 pm and decided to be seen in ER, as she wasn't feeling better. She did not have recurrent chest pain, but had continual chest pressure since the episode earlier in the day. She denies medical non-adherence.   On arrival, BP was 140/76, HR 80,  afebrile. Troponin negative X 2. TSH 0.026. EKG, NSR without acute ST-T wave changes, no WPW or Brugada abnormalities. CXR normal. Due to ongoing headache and chest pressure, she was given morphine which did not help. NTG paste was placed. She has a headache from that. Continues chest pressure but no sharp pain. She states she has a "bad valve" but echo does not reveal valvular abnormalities.   No Known Allergies  Medications Scheduled Medications: . aspirin EC  325 mg Oral Daily  . enoxaparin (LOVENOX) injection  40 mg Subcutaneous Q24H  . levothyroxine  175 mcg Oral QAC breakfast  . nitroGLYCERIN  0.5 inch Topical 4 times per day  . sodium chloride  3 mL Intravenous Q12H  . sodium chloride  3 mL Intravenous Q12H    Infusions:    PRN Medications: sodium chloride, acetaminophen **OR** acetaminophen, alum & mag hydroxide-simeth, HYDROmorphone (DILAUDID) injection, ondansetron **OR** ondansetron (ZOFRAN) IV, oxyCODONE, sodium chloride   Past Medical History  Diagnosis Date  . Chest pain 09/22/2011    2D Echo EF>55%  . Hypothyroidism   . Heart valve problem   . Graves disease     Past Surgical History  Procedure Laterality Date  . Cardiac catheterization  12/31/2000    for chest pain EF 60%  . Breast surgery      Family History  Problem Relation Age of Onset  . Heart disease Mother 66    lung disease ( at [facility] center)  . Heart attack Father 44  deceased  . Stroke Maternal Grandmother     deceased, heart failure  . Diabetes Paternal Grandmother 69    deceased  . Cancer Paternal Grandfather 82    deceased  . Hypertension Brother   . Thyroid disease Brother 31    deceased bleeding at surgery    Social History Kayla Cervantes reports that she has been smoking Cigarettes.  She has a 35 pack-year smoking history. She has never used smokeless tobacco. Kayla Cervantes reports that she does not drink alcohol.  Review of Systems Complete review of systems are found to be negative  unless outlined in H&P above.  Physical Examination Blood pressure 100/62, pulse 60, temperature 97.5 F (36.4 C), temperature source Oral, resp. rate 18, height 5\' 4"  (1.626 m), weight 150 lb 14.4 oz (68.448 kg), SpO2 97 %.  Intake/Output Summary (Last 24 hours) at 01/14/15 1058 Last data filed at 01/14/15 0937  Gross per 24 hour  Intake      6 ml  Output      0 ml  Net      6 ml    Telemetry: NSR with PAC.   GEN: Resting with complaints of a headache.  HEENT: Conjunctiva and lids normal, oropharynx clear with moist mucosa. Neck: Supple, no elevated JVP or carotid bruits, no thyromegaly. Lungs: Clear to auscultation, nonlabored breathing at rest. Cardiac: Regular rate and rhythm, no S3 or significant systolic murmur, no pericardial rub. Abdomen: Soft, nontender, no hepatomegaly, bowel sounds present, no guarding or rebound. Extremities: No pitting edema, distal pulses 2+. Skin: Warm and dry. Musculoskeletal: No kyphosis. Neuropsychiatric: Alert and oriented x3, affect grossly appropriate.  Prior Cardiac Testing/Procedures 1.Echocardiogram: 09/22/2011 (transribed from scanned report).  1. LV systolic fx is normal 2. EF of >55% 3. Transmitral spectral Doppler flow pattern is normal for age 62. Mild Aortic root dilation 5. No aortic regurgitation 6. Right ventricular systolic pressure is normal 7. Left atrial size is normal 8. No significant valvular disease  2. NM stress test 09/22/2011 (transcribed from scanned report) There is no scintigraphic evidence of inducible myocardial ischemia. Normal perfusion. SDS 0. Extent 0%.  The post stress EF is 62%. No significant wall motion abnromalities Exercise capacity 13 METS. EKG shows NSR at 82. T- wave flattening in AVL. Possible left atrial enlargement. No exercised induced ischemic EKG changes noted. TH achieved. Excellent exercise effort. Exercise terminated due to fatigue.  Normal Myocardial Perfusion Study. This is a low risk  scan.   Lab Results  Basic Metabolic Panel:  Recent Labs Lab 01/13/15 2045 01/14/15 0754  NA 138 138  K 4.0 4.5  CL 104 104  CO2 26 29  GLUCOSE 104* 87  BUN 19 14  CREATININE 0.75 0.64  CALCIUM 8.8* 8.9    Liver Function Tests: No results for input(s): AST, ALT, ALKPHOS, BILITOT, PROT, ALBUMIN in the last 168 hours.  CBC:  Recent Labs Lab 01/13/15 2045 01/14/15 0115 01/14/15 0754  WBC 6.2 6.7 5.2  HGB 13.0 13.1 13.2  HCT 38.4 39.2 39.7  MCV 91.9 92.0 91.9  PLT 229 226 230    Cardiac Enzymes:  Recent Labs Lab 01/14/15 0115 01/14/15 0754  TROPONINI <0.03 <0.03    Radiology: Dg Chest 2 View  01/13/2015   CLINICAL DATA:  Chest pain  EXAM: CHEST  2 VIEW  COMPARISON:  01/18/2004  FINDINGS: Lateral imaging is limited by rotation.  Normal heart size and mediastinal contours. No acute infiltrate or edema. No effusion or pneumothorax. No acute osseous  findings.  IMPRESSION: No active cardiopulmonary disease.   Electronically Signed   By: Marnee Spring M.D.   On: 01/13/2015 21:41     ECG: NSR with rate of 84 bpm.    Impression and Recommendations  1. Chest Pain: Typical and atypical features. Chest pressure is "there all the time." but had episode of sharp substernal pain with radiation into her neck with a choking feeling, and jaw pain with diaphoresis. Had some elements of dissociation during the episode. Troponin is negative, EKG is normal. Can consider OP stress test. Will plan echocardiogram to compare from prior for changes.  More recommendations once echo is completed.   2. Hypothyroidism: TSH is very low. Would consider adjusting her medications for better levels as hyperthyroidism is noted per labs,   3. Hx of hypercholesterolemia: Is not on a statin. Will check fasting lipids in the am.   4. Tobacco abuse: Cessation is discussed. Not currently considering stopping.   5. Headache with dissociative symptoms:  Consider neurology consult vs CT scan of  head.   Signed: [name] NP [facility]  01/14/2015, 10:58 AM   Co-Sign MD  Patient seen and discussed with NP Lyman Bishop, I agree with her documentation. 54 yo female with long history of chest pain admitted with chest pain. Symptoms started yesterday around 1030AM while sitting at work. 10/10 sharp pain started in mid chest, then spread throughout chest and into neck and jaw. Similar to her previous episodes but more severe and long in duration. Severe pain lasted approx 10-15 minutes, since then has had chronic dull aching chest pressure.    Cath 2002 LM patent, LAD patent, LCX patent, RCA patent. Echo 2013 LVEF >55%,  08/2011 MPI: no ischemia, exercise capacity 13 METs.  D-dimer neg, K 4, Cr 0.75, Hgb 13, Plt 229, trop neg x 4, TSH 0.026 CXR no acute process EKG SR, LAE, no ischemic changes  Mixed story for cardiac chest pain. Long history of chest pain episodes, this episode is the worst she has ever had. Last ischemic evaluation 3 years ago. No current evidence of ACS. Will plan for echo today, pending results likely plan stress test for tomorrow. She feels too weak at this time for treadmill, likely would pursue Lexiscan MPI. Will need to be NPO after midnight and also have her NG patch discontinued. Will order pending echo results.   Dominga Ferry MD

## 2015-01-14 NOTE — Progress Notes (Signed)
[facility] PROGRESS NOTE  Kayla Cervantes ZOX:[id number] DOB: 08-18-1960 DOA: 01/13/2015 PCP: Cassell Smiles., MD  Assessment/Plan:  Chest pain: typical and atypical features. Continues to feel "pressure" this am. No events on tele. Troponin negative x3. EKG with no acute changes. Await echo results. Await lipid panel.  Evaluated by cardiology who opine no current evidence of ACS and recommend stress test for tomorrow pending results echo. Of note chart review indicated MPI 2013 with no ischemia. Continue Nitropaste, O2, ASA    2. Hypothyroid: TSH 0.026. Will hold Levothyroxine for now. Await free T3 and T4.    3. Tobacco Use Disorder: cessation counseling offered.    Code Status: full Family Communication: family at bedside Disposition Plan: home hopefully tomorrow   Consultants:  cardiology  Procedures:  none  Antibiotics:  none  HPI/Subjective: Sitting up in bed. Complains headache and continues chest "pressur". Denies sob, palpitation, nausea  Objective: Filed Vitals:   01/14/15 0540  BP: 100/62  Pulse: 60  Temp: 97.5 F (36.4 C)  Resp: 18    Intake/Output Summary (Last 24 hours) at 01/14/15 1323 Last data filed at 01/14/15 8119  Gross per 24 hour  Intake      6 ml  Output      0 ml  Net      6 ml   Filed Weights   01/14/15 0141  Weight: 68.448 kg (150 lb 14.4 oz)    Exam:   General:  Well nourished appears somewhat irritable  Cardiovascular: RRR no MGR NO LE edema  Respiratory: normal effort BS clear bilaterally   Abdomen: non-distended +BS no guarding or cyanosis  Musculoskeletal: joints without swelling/erythema   Data Reviewed: Basic Metabolic Panel:  Recent Labs Lab 01/13/15 2045 01/14/15 0754  NA 138 138  K 4.0 4.5  CL 104 104  CO2 26 29  GLUCOSE 104* 87  BUN 19 14  CREATININE 0.75 0.64  CALCIUM 8.8* 8.9   Liver Function Tests: No results for input(s): AST, ALT, ALKPHOS, BILITOT, PROT, ALBUMIN in the last  168 hours. No results for input(s): LIPASE, AMYLASE in the last 168 hours. No results for input(s): AMMONIA in the last 168 hours. CBC:  Recent Labs Lab 01/13/15 2045 01/14/15 0115 01/14/15 0754  WBC 6.2 6.7 5.2  HGB 13.0 13.1 13.2  HCT 38.4 39.2 39.7  MCV 91.9 92.0 91.9  PLT 229 226 230   Cardiac Enzymes:  Recent Labs Lab 01/14/15 0115 01/14/15 0754  TROPONINI <0.03 <0.03   BNP (last 3 results) No results for input(s): BNP in the last 8760 hours.  ProBNP (last 3 results) No results for input(s): PROBNP in the last 8760 hours.  CBG: No results for input(s): GLUCAP in the last 168 hours.  No results found for this or any previous visit (from the past 240 hour(s)).   Studies: Dg Chest 2 View  01/13/2015   CLINICAL DATA:  Chest pain  EXAM: CHEST  2 VIEW  COMPARISON:  01/18/2004  FINDINGS: Lateral imaging is limited by rotation.  Normal heart size and mediastinal contours. No acute infiltrate or edema. No effusion or pneumothorax. No acute osseous findings.  IMPRESSION: No active cardiopulmonary disease.   Electronically Signed   By: Marnee Spring M.D.   On: 01/13/2015 21:41    Scheduled Meds: . aspirin EC  325 mg Oral Daily  . enoxaparin (LOVENOX) injection  40 mg Subcutaneous Q24H  . levothyroxine  175 mcg Oral QAC breakfast  . nitroGLYCERIN  0.5 inch Topical  4 times per day  . sodium chloride  3 mL Intravenous Q12H  . sodium chloride  3 mL Intravenous Q12H   Continuous Infusions:   Active Problems:   Chest pain   Hypothyroidism   Tobacco use disorder    Time spent: 35 minutes    [facility] M  [facility] Pager [phone]. If 7PM-7AM, please contact night-coverage at [email or web address], password [facility] 01/14/2015, 1:23 PM              \

## 2015-01-14 NOTE — ED Provider Notes (Signed)
00:10- patient being seen at the request of Dr. Jodi Mourning to follow-up on second troponin, and d-dimer. She continues to have chest pressure as previous. She denies shortness of breath or diaphoresis at this time. She relates having several similar episodes in the past, but they were self-limited. She describes having a stress test about 3 years ago that was apparently reassuring for no coronary artery disease.  Patients, heart score is 5, by the Heart Pathway- is indicated greater than 1% chance of major acute coronary event within 30 days. She therefore will require admission for definitive testing, guided by cardiology after consultation. Delta troponin is negative.  12:30 AM-Consult complete with Dr. Lovell Sheehan. Patient case explained and discussed. She agrees to admit patient for further evaluation and treatment. Call ended at 00:49   Results for orders placed or performed during the hospital encounter of 01/13/15  CBC  Result Value Ref Range   WBC 6.2 4.0 - 10.5 K/uL   RBC 4.18 3.87 - 5.11 MIL/uL   Hemoglobin 13.0 12.0 - 15.0 g/dL   HCT 16.1 09.6 - 04.5 %   MCV 91.9 78.0 - 100.0 fL   MCH 31.1 26.0 - 34.0 pg   MCHC 33.9 30.0 - 36.0 g/dL   RDW 40.9 81.1 - 91.4 %   Platelets 229 150 - 400 K/uL  Basic metabolic panel  Result Value Ref Range   Sodium 138 135 - 145 mmol/L   Potassium 4.0 3.5 - 5.1 mmol/L   Chloride 104 101 - 111 mmol/L   CO2 26 22 - 32 mmol/L   Glucose, Bld 104 (H) 65 - 99 mg/dL   BUN 19 6 - 20 mg/dL   Creatinine, Ser 7.82 0.44 - 1.00 mg/dL   Calcium 8.8 (L) 8.9 - 10.3 mg/dL   GFR calc non Af Amer >60 >60 mL/min   GFR calc Af Amer >60 >60 mL/min   Anion gap 8 5 - 15  D-dimer, quantitative (not at [facility])  Result Value Ref Range   D-Dimer, Quant <0.27 0.00 - 0.48 ug/mL-FEU  I-stat troponin, ED (0, 3, 6)  not at [facility], [facility]  Result Value Ref Range   Troponin i, poc 0.00 0.00 - 0.08 ng/mL   Comment 3          I-stat troponin, ED (0, 3, 6)  not at [facility], [facility]  Result Value  Ref Range   Troponin i, poc 0.00 0.00 - 0.08 ng/mL   Comment 3           Dg Chest 2 View  01/13/2015   CLINICAL DATA:  Chest pain  EXAM: CHEST  2 VIEW  COMPARISON:  01/18/2004  FINDINGS: Lateral imaging is limited by rotation.  Normal heart size and mediastinal contours. No acute infiltrate or edema. No effusion or pneumothorax. No acute osseous findings.  IMPRESSION: No active cardiopulmonary disease.   Electronically Signed   By: Marnee Spring M.D.   On: 01/13/2015 21:41    Mancel Bale, MD 01/14/15 [phone]

## 2015-01-15 ENCOUNTER — Observation Stay (HOSPITAL_COMMUNITY): Payer: BLUE CROSS/BLUE SHIELD

## 2015-01-15 ENCOUNTER — Encounter (HOSPITAL_COMMUNITY): Payer: Self-pay

## 2015-01-15 DIAGNOSIS — R079 Chest pain, unspecified: Secondary | ICD-10-CM | POA: Diagnosis not present

## 2015-01-15 DIAGNOSIS — R0789 Other chest pain: Secondary | ICD-10-CM | POA: Insufficient documentation

## 2015-01-15 DIAGNOSIS — Z720 Tobacco use: Secondary | ICD-10-CM | POA: Diagnosis not present

## 2015-01-15 DIAGNOSIS — E039 Hypothyroidism, unspecified: Secondary | ICD-10-CM | POA: Diagnosis not present

## 2015-01-15 LAB — NM MYOCAR MULTI W/SPECT W/WALL MOTION / EF
CHL CUP NUCLEAR SRS: 1
CHL CUP RESTING HR STRESS: 56 {beats}/min
LV sys vol: 53 mL
LVDIAVOL: 106 mL
NUC STRESS TID: 1.03
Peak HR: 96 {beats}/min
RATE: 0.38
SDS: 0
SSS: 1

## 2015-01-15 LAB — LIPID PANEL
Cholesterol: 206 mg/dL — ABNORMAL HIGH (ref 0–200)
HDL: 46 mg/dL (ref >40)
LDL Cholesterol: 118 mg/dL — ABNORMAL HIGH (ref 0–99)
TRIGLYCERIDES: 212 mg/dL — AB (ref <150)
Total CHOL/HDL Ratio: 4.5 RATIO
VLDL: 42 mg/dL — ABNORMAL HIGH (ref 0–40)

## 2015-01-15 LAB — T3, FREE: T3 FREE: 3.2 pg/mL (ref 2.0–4.4)

## 2015-01-15 MED ORDER — LEVOTHYROXINE SODIUM 150 MCG PO TABS: 150.0000 ug | ORAL_TABLET | Freq: Every day | ORAL | Status: DC

## 2015-01-15 MED ORDER — ASPIRIN EC 81 MG PO TBEC: 81.0000 mg | DELAYED_RELEASE_TABLET | Freq: Every day | ORAL | Status: DC

## 2015-01-15 MED ORDER — TECHNETIUM TC 99M SESTAMIBI GENERIC - CARDIOLITE
30.0000 | Freq: Once | INTRAVENOUS | Status: AC | PRN
  Administered 2015-01-15: 27 via INTRAVENOUS

## 2015-01-15 MED ORDER — SODIUM CHLORIDE 0.9 % IJ SOLN
INTRAMUSCULAR | Status: AC
  Administered 2015-01-15: 10 mL via INTRAVENOUS
  Filled 2015-01-15: qty 3

## 2015-01-15 MED ORDER — TECHNETIUM TC 99M SESTAMIBI - CARDIOLITE
10.0000 | Freq: Once | INTRAVENOUS | Status: AC | PRN
  Administered 2015-01-15: 07:00:00 9 via INTRAVENOUS

## 2015-01-15 MED ORDER — REGADENOSON 0.4 MG/5ML IV SOLN
INTRAVENOUS | Status: AC
  Administered 2015-01-15: 0.4 mg via INTRAVENOUS
  Filled 2015-01-15: qty 5

## 2015-01-15 NOTE — Progress Notes (Signed)
Patient D/C'd home via w/c accompanied by spouse.  F/U appointments made for patient to f/u with Dr. Sherwood Gambler and Joni Reining, NP.  D/C information provided to patient and spouse with instructions verbally read.  Both state understanding.  Patient D/C in stable condition with no complaints.

## 2015-01-15 NOTE — Progress Notes (Signed)
Stress test is normal, no evidence of any ischemia as the etiology of her chest pain. Appears to be noncardiac chest pain, further workup per primary team. Will sign off of inpatient care. Patient may f/u with [place] as outpatient in 1 month.    Dominga Ferry MD

## 2015-01-15 NOTE — Care Management Note (Signed)
Case Management Note  Patient Details  Name: Kayla Cervantes MRN: [id number] Date of Birth: 04/27/1961  Subjective/Objective:                  Pt admitted from home with CP. Pt lives with significant other and will return home at discharge. Pt is independent with ADL's.  Action/Plan: Anticipate discharge today. No CM needs noted.  Expected Discharge Date:  01/14/15               Expected Discharge Plan:  Home/Self Care  In-House Referral:  NA  Discharge planning Services  CM Consult  Post Acute Care Choice:  NA Choice offered to:  NA  DME Arranged:    DME Agency:     HH Arranged:    HH Agency:     Status of Service:  Completed, signed off  Medicare Important Message Given:    Date Medicare IM Given:    Medicare IM give by:    Date Additional Medicare IM Given:    Additional Medicare Important Message give by:     If discussed at Long Length of Stay Meetings, dates discussed:    Additional Comments:  Cheryl Flash, RN 01/15/2015, 1:17 PM

## 2015-01-15 NOTE — Progress Notes (Signed)
Resting calmly with eyes closed, family in room with snoring respirations.  No complaints of pain. NPO since midnight for cardiac stress test test

## 2015-01-15 NOTE — Progress Notes (Signed)
Consulting cardiologist: Dina Rich MD Primary Cardiologist: Dina Rich MD  Cardiology Specific Problem List: 1. Chest Pain 2. Near Syncope  Subjective:   Patient assessed prior to [facility] Stress test in the lab. She denies recurrent chest pain overnight.    Objective:   Temp:  [98.2 F (36.8 C)] 98.2 F (36.8 C) (08/16 0534) Pulse Rate:  [56-58] 58 (08/16 0534) Resp:  [18] 18 (08/16 0534) BP: (98-107)/(61-69) 107/69 mmHg (08/16 0534) SpO2:  [96 %-99 %] 99 % (08/16 0534) Last BM Date: 01/14/15  Filed Weights   01/14/15 0141  Weight: 150 lb 14.4 oz (68.448 kg)    Intake/Output Summary (Last 24 hours) at 01/15/15 0819 Last data filed at 01/14/15 2200  Gross per 24 hour  Intake    489 ml  Output      0 ml  Net    489 ml    Telemetry: NSR  Exam:  General: No acute distress.  HEENT: Conjunctiva and lids normal, oropharynx clear.  Lungs: Clear to auscultation, nonlabored.  Cardiac: No elevated JVP or bruits. RRR, no gallop or rub.   Abdomen: Normoactive bowel sounds, nontender, nondistended.  Extremities: No pitting edema, distal pulses full.  Neuropsychiatric: Alert and oriented x3, affect appropriate.   Lab Results:  Basic Metabolic Panel:  Recent Labs Lab 01/13/15 2045 01/14/15 0754  NA 138 138  K 4.0 4.5  CL 104 104  CO2 26 29  GLUCOSE 104* 87  BUN 19 14  CREATININE 0.75 0.64  CALCIUM 8.8* 8.9    CBC:  Recent Labs Lab 01/13/15 2045 01/14/15 0115 01/14/15 0754  WBC 6.2 6.7 5.2  HGB 13.0 13.1 13.2  HCT 38.4 39.2 39.7  MCV 91.9 92.0 91.9  PLT 229 226 230    Cardiac Enzymes:  Recent Labs Lab 01/14/15 0115 01/14/15 0754 01/14/15 1355  TROPONINI <0.03 <0.03 <0.03    BNP: No results for input(s): PROBNP in the last 8760 hours.  Coagulation: No results for input(s): INR in the last 168 hours.  Radiology: Dg Chest 2 View  01/13/2015   CLINICAL DATA:  Chest pain  EXAM: CHEST  2 VIEW  COMPARISON:  01/18/2004   FINDINGS: Lateral imaging is limited by rotation.  Normal heart size and mediastinal contours. No acute infiltrate or edema. No effusion or pneumothorax. No acute osseous findings.  IMPRESSION: No active cardiopulmonary disease.   Electronically Signed   By: Marnee Spring M.D.   On: 01/13/2015 21:41     ECG: Prior to stress test at baseline, NSR with inferior Q waves. Rate of 70 bpm   Medications:   Scheduled Medications: . aspirin EC  325 mg Oral Daily  . enoxaparin (LOVENOX) injection  40 mg Subcutaneous Q24H  . levothyroxine  150 mcg Oral QAC breakfast  . sodium chloride  3 mL Intravenous Q12H  . sodium chloride  3 mL Intravenous Q12H    Infusions:    PRN Medications: sodium chloride, acetaminophen **OR** acetaminophen, alum & mag hydroxide-simeth, HYDROmorphone (DILAUDID) injection, ondansetron **OR** ondansetron (ZOFRAN) IV, oxyCODONE, sodium chloride, technetium sestamibi generic   Assessment and Plan:   1.Chest Pain: Typical and atypical features.Troponin negative. Lexiscan/MPI is in process. No pain over night. Will make further recommendations after results are available. Echo normal, no valvular abnormalities.   2. Tobacco abuse: Continued cessation counseling.   [name] NP [facility]  01/15/2015, 8:19 AM   Patient with long history of chest pain. Recent episodes increased in severity and duration, worst she  has ever had. EKG with nonspecific ST/T changes, troponin negative x3. Negative stress test 3 years ago, due to progression of symptoms we have repeated a nuclear stress today. Further recs pending stress test results. Echo with normal function.    Dominga Ferry MD

## 2015-01-15 NOTE — Discharge Summary (Signed)
Physician Discharge Summary  BRI WAKEMAN ZOX:[id number] DOB: 13-Feb-1961 DOA: 01/13/2015  PCP: Cassell Smiles., MD  Admit date: 01/13/2015 Discharge date: 01/15/2015  Time spent:40 minutes  Recommendations for Outpatient Follow-up:  1. Follow up Kayla Cervantes 02/15/15  2. Follow up with PCP in 1-2 weeks for evaluations of symptoms and repeat TSH in 4 weeks  Discharge Diagnoses:  Active Problems:   Chest pain   Hypothyroidism   Tobacco use disorder   Discharge Condition: stable  Diet recommendation: heart healthy  Filed Weights   01/14/15 0141  Weight: 68.448 kg (150 lb 14.4 oz)    History of present illness:  Kayla Cervantes is a 54 y.o. female with a history of Graves Disease S/P RAI Rx who presented to the ED on 01/14/15 with complaints of greater than 10/10 sharp and pressure like chest pain since 10 AM. She reported having SOB and Diaphoresis along with light headedness associated with her pain. The pain radiated into her jaw. She has a Family history of CAD, and is a smoker. She was evaluated in the ED and her initial troponin was negative. She was referred for further evaluation.   Hospital Course:  1. Chest pain: typical and atypical features. No events on tele. Troponin negative x3. EKG with no acute changes. Echo withThe estimated ejectionfraction was in the range of 55% to 60%. Wall motion was normal;there were no regional wall motion abnormalities. Leftventricular diastolic function parameters were normal.  results. Await lipid panel. Evaluated by cardiology who opine no evidence of ACS. Stress test on 01/15/15 normal. Will follow up with cardiology in 1 month.     2. Hypothyroid: TSH 0.026. Levothyroxine dose decreased. Recommend TSH 4-6 weeks.    3. Tobacco Use Disorder: cessation counseling offered.    Procedures:  Echo as above  Stress test  Consultations:  cardiology  Discharge Exam: Filed Vitals:   01/15/15 0937  BP: 120/57   Pulse: 57  Temp: 97.8 F (36.6 C)  Resp: 18    General: well nourished appears comfortable Cardiovascular: RRR no MGR no LE edema Respiratory: normal effort BS clear bilaterally  Discharge Instructions   Discharge Instructions    Diet - low sodium heart healthy    Complete by:  As directed      Discharge instructions    Complete by:  As directed   Follow up with PCP as instucted     Increase activity slowly    Complete by:  As directed           Current Discharge Medication List    CONTINUE these medications which have CHANGED   Details  levothyroxine (SYNTHROID, LEVOTHROID) 150 MCG tablet Take 1 tablet (150 mcg total) by mouth daily before breakfast. Qty: 60 tablet, Refills: 0      CONTINUE these medications which have NOT CHANGED   Details  acetaminophen (TYLENOL) 500 MG tablet Take 1,000 mg by mouth every 6 (six) hours as needed for mild pain.    cyclobenzaprine (FLEXERIL) 5 MG tablet Take 1 tablet (5 mg total) by mouth 3 (three) times daily as needed for muscle spasms. Qty: 30 tablet, Refills: 0    HYDROcodone-acetaminophen (NORCO/VICODIN) 5-325 MG per tablet Take 1 tablet by mouth every 4 (four) hours as needed for moderate pain. Qty: 20 tablet, Refills: 0       No Known Allergies Follow-up Information    Follow up with Joni Reining, NP On 02/15/2015.   Specialties:  Nurse Practitioner, Radiology, Cardiology   Why:  at 1:10    Contact information:   [address] [place] [address] [phone]       Schedule an appointment as soon as possible for a visit with Cassell Smiles., MD.   Specialty:  Internal Medicine   Why:  1-2 weeks for evaluation of symptoms   Contact information:   [address] [place] [address] [phone]        The results of significant diagnostics from this hospitalization (including imaging, microbiology, ancillary and laboratory) are listed below for reference.    Significant Diagnostic Studies: Dg  Chest 2 View  01/13/2015   CLINICAL DATA:  Chest pain  EXAM: CHEST  2 VIEW  COMPARISON:  01/18/2004  FINDINGS: Lateral imaging is limited by rotation.  Normal heart size and mediastinal contours. No acute infiltrate or edema. No effusion or pneumothorax. No acute osseous findings.  IMPRESSION: No active cardiopulmonary disease.   Electronically Signed   By: Marnee Spring M.D.   On: 01/13/2015 21:41   Nm Myocar Multi W/spect W/wall Motion / Ef  01/15/2015    There was no ST segment deviation noted during stress.  The study is normal. There are no perfusion defects.  This is a low risk study.  The left ventricular ejection fraction is mildly decreased (45-54%).     Microbiology: No results found for this or any previous visit (from the past 240 hour(s)).   Labs: Basic Metabolic Panel:  Recent Labs Lab 01/13/15 2045 01/14/15 0754  NA 138 138  K 4.0 4.5  CL 104 104  CO2 26 29  GLUCOSE 104* 87  BUN 19 14  CREATININE 0.75 0.64  CALCIUM 8.8* 8.9   Liver Function Tests: No results for input(s): AST, ALT, ALKPHOS, BILITOT, PROT, ALBUMIN in the last 168 hours. No results for input(s): LIPASE, AMYLASE in the last 168 hours. No results for input(s): AMMONIA in the last 168 hours. CBC:  Recent Labs Lab 01/13/15 2045 01/14/15 0115 01/14/15 0754  WBC 6.2 6.7 5.2  HGB 13.0 13.1 13.2  HCT 38.4 39.2 39.7  MCV 91.9 92.0 91.9  PLT 229 226 230   Cardiac Enzymes:  Recent Labs Lab 01/14/15 0115 01/14/15 0754 01/14/15 1355  TROPONINI <0.03 <0.03 <0.03   BNP: BNP (last 3 results) No results for input(s): BNP in the last 8760 hours.  ProBNP (last 3 results) No results for input(s): PROBNP in the last 8760 hours.  CBG: No results for input(s): GLUCAP in the last 168 hours.     SignedGwenyth Bender  [facility] 01/15/2015, 1:59 PM

## 2015-02-15 ENCOUNTER — Encounter: Payer: BLUE CROSS/BLUE SHIELD | Admitting: Adult Health

## 2015-02-15 NOTE — Progress Notes (Signed)
Cardiology Office Note   Date:  02/15/2015   ID:  Kayla Cervantes, DOB 02-04-1961, MRN [id number]  PCP:  Cassell Smiles., MD  Cardiologist:  Arlington Calix, NP   NO SHOW

## 2015-02-26 ENCOUNTER — Telehealth: Payer: Self-pay

## 2015-02-27 NOTE — Telephone Encounter (Signed)
PT called back and gave me medication list. Her last colonoscopy was 05/06/2005 by [facility]. She has to check on work schedule to schedule appt and will call back.

## 2015-02-27 NOTE — Telephone Encounter (Signed)
Pt called back. Said she checked on her insurance and they told her it would be covered as long as she IS having a problem. ( When I triaged her she told me she was not having any GI problems).   She did have some chest pain a month or so ago and had to have cardiac work up. She said everything checked out fine.  She said the doctors were concerned about something being wrong in her esophagus.  However, she wants just a colonoscopy and I told her that will be screening.  Pt does not want to schedule an appt for OV, just schedule the colonoscopy.  Please advise on what I should do!

## 2015-02-27 NOTE — Telephone Encounter (Signed)
LMOM to call to complete medication list.

## 2015-02-27 NOTE — Telephone Encounter (Signed)
If patient is not having any lower GI symptoms, then screening colonoscopy OK. It is up to her to know her benefits regarding screening colonoscopy and make sure she is aware of that.   It appears that they felt her chest pain was not cardiac in origin.

## 2015-02-27 NOTE — Telephone Encounter (Signed)
Gastroenterology Pre-Procedure Review  Request Date: 02/27/2015 Requesting Physician: Dr. Sherwood Gambler  PATIENT REVIEW QUESTIONS: The patient responded to the following health history questions as indicated:    Last colonoscopy 05/06/2005/ please see separate notes and advise  1. Diabetes Melitis: no 2. Joint replacements in the past 12 months: no 3. Major health problems in the past 3 months: CHEST PAIN AND CARDIAC WORKUP RECENTLY 4. Has an artificial valve or MVP: no 5. Has a defibrillator: no 6. Has been advised in past to take antibiotics in advance of a procedure like teeth cleaning: no 7. Family history of colon cancer: yes   PATERNAL GRANDFATHER IN 32"S 8. Alcohol Use: no    MEDICATIONS & ALLERGIES:    Patient reports the following regarding taking any blood thinners:   Plavix? no Aspirin? no Coumadin? no  Patient confirms/reports the following medications:  Current Outpatient Prescriptions  Medication Sig Dispense Refill  . acetaminophen (TYLENOL) 500 MG tablet Take 1,000 mg by mouth every 6 (six) hours as needed for mild pain.    Marland Kitchen levothyroxine (SYNTHROID, LEVOTHROID) 150 MCG tablet Take 1 tablet (150 mcg total) by mouth daily before breakfast. 60 tablet 0  . cyclobenzaprine (FLEXERIL) 5 MG tablet Take 1 tablet (5 mg total) by mouth 3 (three) times daily as needed for muscle spasms. (Patient not taking: Reported on 02/27/2015) 30 tablet 0  . HYDROcodone-acetaminophen (NORCO/VICODIN) 5-325 MG per tablet Take 1 tablet by mouth every 4 (four) hours as needed for moderate pain. (Patient not taking: Reported on 02/27/2015) 20 tablet 0   No current facility-administered medications for this visit.    Patient confirms/reports the following allergies:  No Known Allergies  No orders of the defined types were placed in this encounter.    AUTHORIZATION INFORMATION Primary Insurance:   ID #:   Group #:  Pre-Cert / Auth required: Pre-Cert / Auth #:   Secondary Insurance:   ID #:   Group #:  Pre-Cert / Auth required:  Pre-Cert / Auth #:   SCHEDULE INFORMATION: Procedure has been scheduled as follows:  Date:               Time:   Location:   This Gastroenterology Pre-Precedure Review Form is being routed to the following provider(s): R. Roetta Sessions, MD

## 2015-02-27 NOTE — Telephone Encounter (Signed)
Routing to Leslie Lewis, PA to advise! 

## 2015-03-01 ENCOUNTER — Other Ambulatory Visit: Payer: Self-pay

## 2015-03-01 DIAGNOSIS — Z1211 Encounter for screening for malignant neoplasm of colon: Secondary | ICD-10-CM

## 2015-03-01 NOTE — Telephone Encounter (Signed)
I spoke to pt. She has been scheduled for 03/18/2015 at 10:30 Am with Dr. Jena Gauss for her colonoscopy. She is aware it is her responsibility to check with her insurance and let them know it is a screening colonoscopy and see what her benefits and responsibilities are.

## 2015-03-01 NOTE — Telephone Encounter (Signed)
LMOM for pt to call. 

## 2015-03-05 MED ORDER — PEG 3350-KCL-NA BICARB-NACL 420 G PO SOLR: 4000.0000 mL | ORAL | Status: DC

## 2015-03-05 NOTE — Telephone Encounter (Signed)
Rx sent to the pharmacy and instructions mailed to pt.  

## 2015-03-14 ENCOUNTER — Telehealth: Payer: Self-pay

## 2015-03-14 ENCOUNTER — Other Ambulatory Visit: Payer: Self-pay

## 2015-03-14 NOTE — Telephone Encounter (Signed)
I called [employer] @ [phone] and spoke to IndustryBrandy H who said a PA is not required for a screening colonoscopy. Reference # [id number].

## 2015-03-14 NOTE — Telephone Encounter (Signed)
Opened in error

## 2015-03-18 ENCOUNTER — Encounter (HOSPITAL_COMMUNITY): Payer: Self-pay | Admitting: *Deleted

## 2015-03-18 ENCOUNTER — Encounter (HOSPITAL_COMMUNITY)
Admission: RE | Disposition: A | Discharge status: Home / Self Care | Payer: Self-pay | Source: Ambulatory Visit | Attending: Internal Medicine

## 2015-03-18 ENCOUNTER — Ambulatory Visit (HOSPITAL_COMMUNITY)
Admission: RE | Admit: 2015-03-18 | Discharge: 2015-03-18 | Disposition: A | Discharge status: Home / Self Care | Payer: BLUE CROSS/BLUE SHIELD | Source: Ambulatory Visit | Attending: Internal Medicine | Admitting: Internal Medicine

## 2015-03-18 DIAGNOSIS — Z800 Family history of malignant neoplasm of digestive organs: Secondary | ICD-10-CM | POA: Insufficient documentation

## 2015-03-18 DIAGNOSIS — Z8371 Family history of colonic polyps: Secondary | ICD-10-CM | POA: Diagnosis not present

## 2015-03-18 DIAGNOSIS — Z1211 Encounter for screening for malignant neoplasm of colon: Secondary | ICD-10-CM | POA: Diagnosis not present

## 2015-03-18 DIAGNOSIS — E039 Hypothyroidism, unspecified: Secondary | ICD-10-CM | POA: Diagnosis not present

## 2015-03-18 DIAGNOSIS — F17210 Nicotine dependence, cigarettes, uncomplicated: Secondary | ICD-10-CM | POA: Insufficient documentation

## 2015-03-18 DIAGNOSIS — Z79899 Other long term (current) drug therapy: Secondary | ICD-10-CM | POA: Insufficient documentation

## 2015-03-18 HISTORY — PX: COLONOSCOPY: SHX5424

## 2015-03-18 SURGERY — COLONOSCOPY
Anesthesia: Moderate Sedation

## 2015-03-18 MED ORDER — SODIUM CHLORIDE 0.9 % IV SOLN
INTRAVENOUS | Status: DC
  Administered 2015-03-18: 09:00:00 via INTRAVENOUS

## 2015-03-18 MED ORDER — MEPERIDINE HCL 100 MG/ML IJ SOLN
INTRAMUSCULAR | Status: DC | PRN
  Administered 2015-03-18 (×2): 50 mg via INTRAVENOUS
  Administered 2015-03-18: 25 mg via INTRAVENOUS

## 2015-03-18 MED ORDER — ONDANSETRON HCL 4 MG/2ML IJ SOLN
INTRAMUSCULAR | Status: AC
  Filled 2015-03-18: qty 2

## 2015-03-18 MED ORDER — STERILE WATER FOR IRRIGATION IR SOLN
Status: DC | PRN
Start: 2015-03-18 — End: 2015-03-18
  Administered 2015-03-18: 10:00:00

## 2015-03-18 MED ORDER — ONDANSETRON HCL 4 MG/2ML IJ SOLN
INTRAMUSCULAR | Status: DC | PRN
  Administered 2015-03-18: 4 mg via INTRAVENOUS

## 2015-03-18 MED ORDER — MEPERIDINE HCL 100 MG/ML IJ SOLN
INTRAMUSCULAR | Status: AC
  Filled 2015-03-18: qty 2

## 2015-03-18 MED ORDER — MIDAZOLAM HCL 5 MG/5ML IJ SOLN
INTRAMUSCULAR | Status: DC | PRN
  Administered 2015-03-18: 2 mg via INTRAVENOUS
  Administered 2015-03-18: 1 mg via INTRAVENOUS
  Administered 2015-03-18: 2 mg via INTRAVENOUS

## 2015-03-18 MED ORDER — MIDAZOLAM HCL 5 MG/5ML IJ SOLN
INTRAMUSCULAR | Status: AC
  Filled 2015-03-18: qty 10

## 2015-03-18 NOTE — Op Note (Signed)
[facility] [address] [place], [id number]   COLONOSCOPY PROCEDURE REPORT  PATIENT: Kayla Cervantes, Kayla Cervantes  MR#: [id number] BIRTHDATE: 06/07/1960 , 54  yrs. old GENDER: female ENDOSCOPIST: R.  Roetta SessionsMichael Emilie Carp, MD FACP [facility] REFERRED XB:JYNWGBY:Larry Sherwood GamblerFusco, M.Cervantes. PROCEDURE DATE:  03/18/2015 PROCEDURE:   Colonoscopy, screening INDICATIONS:High risk screening examination (positive family history of colon polyps in her sister and colon cancer in a secondary relative). MEDICATIONS: Versed 5 mg IV and Demerol 125 mg IV in divided doses. Zofran 4 mg IV. ASA CLASS:       Class II  CONSENT: The risks, benefits, alternatives and imponderables including but not limited to bleeding, perforation as well as the possibility of a missed lesion have been reviewed.  The potential for biopsy, lesion removal, etc. have also been discussed. Questions have been answered.  All parties agreeable.  Please see the history and physical in the medical record for more information.  DESCRIPTION OF PROCEDURE:   After the risks benefits and alternatives of the procedure were thoroughly explained, informed consent was obtained.  The digital rectal exam revealed no abnormalities of the rectum.   The EC-[id number] ([id number]  endoscope was introduced through the anus and advanced to the cecum, which was identified by both the appendix and ileocecal valve. No adverse events experienced.   The quality of the prep was adequate  The instrument was then slowly withdrawn as the colon was fully examined. Estimated blood loss is zero unless otherwise noted in this procedure report.      COLON FINDINGS: Normal-appearing rectal mucosa.  Normal-appearing colonic mucosa.  Retroflexion was performed. .  Withdrawal time=8 minutes 0 seconds.  The scope was withdrawn and the procedure completed. COMPLICATIONS: There were no immediate complications.  ENDOSCOPIC IMPRESSION: Normal colonoscopy  RECOMMENDATIONS: Repeat  colonoscopy in 5 years for screening purposes.  eSigned:  R. Roetta SessionsMichael Kaelan Amble, MD Jerrel IvoryFACP [facility] 03/18/2015 10:12 AM   cc:  CPT CODES: ICD CODES:  The ICD and CPT codes recommended by this software are interpretations from the data that the clinical staff has captured with the software.  The verification of the translation of this report to the ICD and CPT codes and modifiers is the sole responsibility of the health care institution and practicing physician where this report was generated.  [employer] will not be held responsible for the validity of the ICD and CPT codes included on this report.  AMA assumes no liability for data contained or not contained herein. CPT is a Publishing rights managerregistered trademark of the [employer].

## 2015-03-18 NOTE — Discharge Instructions (Signed)

## 2015-03-18 NOTE — H&P (Addendum)
 @LOGO @   Primary Care Physician:  Cassell SmilesFUSCO,LAWRENCE J., MD Primary Gastroenterologist:  Dr. Jena Gaussourk  Pre-Procedure History & Physical: HPI:  Kayla Cervantes is a 54 y.o. female is here for a screening colonoscopy. No bowel symptoms. No family history of colon cancer in any first-degree relatives. Patient reports colonoscopy many, many years ago for rectal bleeding  -  without significant findings. Patient notes that her younger sister has had colonic polyps and is in a surveillance program.  Past Medical History  Diagnosis Date  . Chest pain 09/22/2011    2D Echo EF>55%  . Hypothyroidism   . Heart valve problem   . Graves disease     Past Surgical History  Procedure Laterality Date  . Cardiac catheterization  12/31/2000    for chest pain EF 60%  . Cesarean section    . Left knee arthroscopy    . Breast surgery      X 5-benign lumpectomies  . Back surgery    . Colonoscopy    . Esophagogastroduodenoscopy      Prior to Admission medications   Medication Sig Start Date End Date Taking? Authorizing Provider  acetaminophen (TYLENOL) 500 MG tablet Take 1,000 mg by mouth every 6 (six) hours as needed for mild pain.   Yes Historical Provider, MD  levothyroxine (SYNTHROID, LEVOTHROID) 150 MCG tablet Take 1 tablet (150 mcg total) by mouth daily before breakfast. 01/15/15  Yes Lesle ChrisKaren M Black, NP  polyethylene glycol-electrolytes (TRILYTE) 420 G solution Take 4,000 mLs by mouth as directed. 03/05/15  Yes Corbin Adeobert M Chabeli Barsamian, MD    Allergies as of 03/01/2015  . (No Known Allergies)    Family History  Problem Relation Age of Onset  . Heart disease Mother [phone]    [facility]  . Heart attack Father 1438    deceased  . Stroke Maternal Grandmother     deceased, heart failure  . Diabetes Paternal Grandmother 279    deceased  . Cancer Paternal Grandfather 5680    deceased  . Colon cancer Paternal Grandfather   . Hypertension Brother   . Thyroid disease Brother 2719    deceased  bleeding at surgery    Social History   Social History  . Marital Status: Divorced    Spouse Name: N/A  . Number of Children: N/A  . Years of Education: N/A   Occupational History  . Community education officerAssistant Manager [employer]    [place]   Social History Main Topics  . Smoking status: Current Every Day Smoker -- 1.00 packs/day for 35 years    Types: Cigarettes  . Smokeless tobacco: Never Used  . Alcohol Use: No  . Drug Use: No     Comment: Heavy caffeine.   . Sexual Activity: Not on file   Other Topics Concern  . Not on file   Social History Narrative    Review of Systems: See HPI, otherwise negative ROS  Physical Exam: BP 114/79 mmHg  Pulse 70  Temp(Src) 97.5 F (36.4 C) (Oral)  Resp 15  Ht 5\' 4"  (1.626 m)  Wt 150 lb (68.04 kg)  BMI 25.73 kg/m2  SpO2 99% General:   Alert,  Well-developed, well-nourished, pleasant and cooperative in NAD Head:  Normocephalic and atraumatic. Lungs:  Clear throughout to auscultation.   No wheezes, crackles, or rhonchi. No acute distress. Heart:  Regular rate and rhythm; no murmurs, clicks, rubs,  or gallops. Abdomen:  Soft, nontender and nondistended. No masses, hepatosplenomegaly or hernias noted. Normal bowel  sounds, without guarding, and without rebound.   Msk:  Symmetrical without gross deformities. Normal posture. Pulses:  Normal pulses noted. Extremities:  Without clubbing or edema.  Impression/Plan: [name] is now here to undergo a screening colonoscopy.  This may be a high-risk screening examination given reported history of colonic polyps in her sister. Risks, benefits, limitations, imponderables and alternatives regarding colonoscopy have been reviewed with the patient. Questions have been answered. All parties agreeable.     Notice:  This dictation was prepared with Dragon dictation along with smaller phrase technology. Any transcriptional errors that result from this process are unintentional and may not be corrected upon review.

## 2015-03-20 ENCOUNTER — Ambulatory Visit (INDEPENDENT_AMBULATORY_CARE_PROVIDER_SITE_OTHER): Payer: BLUE CROSS/BLUE SHIELD | Admitting: Obstetrics and Gynecology

## 2015-03-20 ENCOUNTER — Encounter: Payer: Self-pay | Admitting: Obstetrics and Gynecology

## 2015-03-20 ENCOUNTER — Other Ambulatory Visit (HOSPITAL_COMMUNITY)
Admission: RE | Admit: 2015-03-20 | Discharge: 2015-03-20 | Disposition: A | Discharge status: Home / Self Care | Payer: BLUE CROSS/BLUE SHIELD | Source: Ambulatory Visit | Attending: Obstetrics and Gynecology | Admitting: Obstetrics and Gynecology

## 2015-03-20 VITALS — BP 120/80 | Ht 64.0 in | Wt 156.0 lb

## 2015-03-20 DIAGNOSIS — Z1151 Encounter for screening for human papillomavirus (HPV): Secondary | ICD-10-CM | POA: Diagnosis not present

## 2015-03-20 DIAGNOSIS — Z01411 Encounter for gynecological examination (general) (routine) with abnormal findings: Secondary | ICD-10-CM | POA: Insufficient documentation

## 2015-03-20 DIAGNOSIS — Z01419 Encounter for gynecological examination (general) (routine) without abnormal findings: Secondary | ICD-10-CM | POA: Diagnosis not present

## 2015-03-20 NOTE — Progress Notes (Signed)
Patient ID: Kayla Cervantes, female   DOB: [birth year], 54 y.o.   MRN: [id number] Pt here today for her annual exam. Pt denies any problems or concerns at this time.

## 2015-03-20 NOTE — Progress Notes (Signed)
Patient ID: Kayla Cervantes, female   DOB: Feb 20, 1961, 54 y.o.   MRN: [id number]  Assessment:  Annual Gyn Exam   Plan:  1. pap smear done, next pap due in 3 years 2. return annually or prn, hemoccult thru primary care in alternate yrs. 3    Annual mammogram advised Subjective:  Kayla RushCindy D Mutz is a 54 y.o. female [phone] who presents for annual exam. No LMP recorded. Patient is postmenopausal. The patient has no complaints today. Pt was hospitalized for a heart attack several months ago. She recently had a check up and her heart checked out fine. Pt has had 3 breast surgeries. She has family history of colon cancer and has been advised to have a colonoscopy every 5 years.  The following portions of the patient's history were reviewed and updated as appropriate: allergies, current medications, past family history, past medical history, past social history, past surgical history and problem list. Past Medical History  Diagnosis Date  . Chest pain 09/22/2011    2D Echo EF>55%  . Hypothyroidism   . Heart valve problem   . Graves disease     Past Surgical History  Procedure Laterality Date  . Cardiac catheterization  12/31/2000    for chest pain EF 60%  . Cesarean section    . Left knee arthroscopy    . Breast surgery      X 5-benign lumpectomies  . Back surgery    . Colonoscopy    . Esophagogastroduodenoscopy       Current outpatient prescriptions:  .  acetaminophen (TYLENOL) 500 MG tablet, Take 1,000 mg by mouth every 6 (six) hours as needed for mild pain., Disp: , Rfl:  .  levothyroxine (SYNTHROID, LEVOTHROID) 150 MCG tablet, Take 1 tablet (150 mcg total) by mouth daily before breakfast., Disp: 60 tablet, Rfl: 0  Review of Systems Constitutional: negative Gastrointestinal: negative Genitourinary: negative  Objective:  BP 120/80 mmHg  Ht 5\' 4"  (1.626 m)  Wt 156 lb (70.761 kg)  BMI 26.76 kg/m2   BMI: Body mass index is 26.76 kg/(m^2).  General Appearance: Alert, appropriate  appearance for age. No acute distress Breast Exam: several scars on right breast. No masses or nodes. No dimpling, nipple retraction or discharge Gastrointestinal: Soft, non-tender, no masses or organomegaly Pelvic Exam: normal external genitalia. Good vaginal support. Cervix is normal and well supported. Normal bimanual exam Rectovaginal: normal with good support Skin: no rash or abnormalities Neurologic: Normal gait and speech, no tremor  Psychiatric: Alert and oriented, appropriate affect.   Urinalysis:Not done  Christin BachJohn Nijee Heatwole. MD Pgr [phone] 11:40 AM  By signing my name below, I, Jarvis Morganaylor Daun Rens, attest that this documentation has been prepared under the direction and in the presence of Tilda BurrowJohn Shandra Szymborski V, MD. Electronically Signed: Jarvis Morganaylor Keegan Ducey, ED Scribe. 03/20/2015. 11:40 AM.  I personally performed the services described in this documentation, which was SCRIBED in my presence. The recorded information has been reviewed and considered accurate. It has been edited as necessary during review. Tilda BurrowFERGUSON,Rebekkah Powless V, MD

## 2015-03-21 ENCOUNTER — Encounter (HOSPITAL_COMMUNITY): Payer: Self-pay | Admitting: Internal Medicine

## 2015-03-21 LAB — CYTOLOGY - PAP

## 2015-03-25 ENCOUNTER — Telehealth: Payer: Self-pay | Admitting: *Deleted

## 2015-03-25 NOTE — Telephone Encounter (Signed)
-----   Message from Tilda BurrowJohn Ferguson V, MD sent at 03/23/2015  4:24 AM EDT ----- HSIL, for colpo, please make appt

## 2015-03-25 NOTE — Telephone Encounter (Signed)
Pt informed of abnormal pap (HSIL, +HPV) 03/20/2015. Pt scheduled for colposcopy 03/26/2015.

## 2015-03-26 ENCOUNTER — Other Ambulatory Visit: Payer: Self-pay | Admitting: Obstetrics and Gynecology

## 2015-03-26 ENCOUNTER — Ambulatory Visit (INDEPENDENT_AMBULATORY_CARE_PROVIDER_SITE_OTHER): Payer: BLUE CROSS/BLUE SHIELD | Admitting: Obstetrics and Gynecology

## 2015-03-26 VITALS — BP 120/72 | Ht 64.0 in | Wt 155.0 lb

## 2015-03-26 DIAGNOSIS — R87613 High grade squamous intraepithelial lesion on cytologic smear of cervix (HGSIL): Secondary | ICD-10-CM | POA: Diagnosis not present

## 2015-03-26 NOTE — Progress Notes (Signed)
Patient ID: Kayla Cervantes, female   DOB: [birth year], 54 y.o.   MRN: [id number] Pt here today for colposcopy due to a HSIL pap with HPV detected.

## 2015-03-29 NOTE — Progress Notes (Signed)
Patient ID: Kayla Cervantes, female   DOB: 1960-09-08, 54 y.o.   MRN: [id number]  Kayla RushCindy D Cervantes 54 y.o. [id number] here for colposcopy for high-grade squamous intraepithelial neoplasia  (HGSIL-encompassing moderate and severe dysplasia) pap smear on 03/2015.  Discussed role for HPV in cervical dysplasia, need for surveillance.  Patient given informed consent, signed copy in the chart, time out was performed.  Placed in lithotomy position. Cervix viewed with speculum and colposcope after application of acetic acid.   Colposcopy adequate? Yes   o'clock; biopsies obtained at .   ECC specimen obtained.yes All specimens were labelled and sent to pathology.  Colposcopy IMPRESSION: no visiblle dysplasia  Patient was given post procedure instructions. Will follow up pathology and manage accordingly.  Routine preventative health maintenance measures emphasized.   Results of ecc: cinI fragments. Some possibility of high grade lesion Will discuss with pt and recommend that we do LEEP jvf 10/29

## 2015-03-30 DIAGNOSIS — R87613 High grade squamous intraepithelial lesion on cytologic smear of cervix (HGSIL): Secondary | ICD-10-CM | POA: Insufficient documentation

## 2015-03-30 HISTORY — DX: High grade squamous intraepithelial lesion on cytologic smear of cervix (HGSIL): R87.613

## 2015-04-01 ENCOUNTER — Encounter: Payer: Self-pay | Admitting: Obstetrics and Gynecology

## 2015-04-01 ENCOUNTER — Telehealth: Payer: Self-pay | Admitting: *Deleted

## 2015-04-01 NOTE — Telephone Encounter (Signed)
Pt was informed of her results and advised that she would need a LEEP in our office. Pt verbalized understanding. Phone call switched to front office and appointment was made.

## 2015-04-01 NOTE — Telephone Encounter (Signed)
-----   Message from Tilda BurrowJohn Ferguson V, MD sent at 03/30/2015  5:11 AM EDT ----- Call pt. colpo was reasssuring , but ecc showed low grade abnl with a ? Of high grade component. Since it is in the endocervix, will advise leep

## 2015-04-03 ENCOUNTER — Ambulatory Visit: Payer: BLUE CROSS/BLUE SHIELD | Admitting: Obstetrics and Gynecology

## 2015-04-08 ENCOUNTER — Ambulatory Visit (INDEPENDENT_AMBULATORY_CARE_PROVIDER_SITE_OTHER): Payer: BLUE CROSS/BLUE SHIELD | Admitting: Obstetrics and Gynecology

## 2015-04-08 ENCOUNTER — Encounter: Payer: Self-pay | Admitting: Obstetrics and Gynecology

## 2015-04-08 ENCOUNTER — Other Ambulatory Visit: Payer: Self-pay | Admitting: Obstetrics and Gynecology

## 2015-04-08 VITALS — BP 112/80 | Ht 64.0 in | Wt 153.0 lb

## 2015-04-08 DIAGNOSIS — R87613 High grade squamous intraepithelial lesion on cytologic smear of cervix (HGSIL): Secondary | ICD-10-CM | POA: Diagnosis not present

## 2015-04-08 DIAGNOSIS — N871 Moderate cervical dysplasia: Secondary | ICD-10-CM | POA: Diagnosis not present

## 2015-04-08 HISTORY — DX: Moderate cervical dysplasia: N87.1

## 2015-04-08 NOTE — Progress Notes (Signed)
Patient ID: Kayla RushCindy D Cervantes, female   DOB: Dec 22, 1960, 54 y.o.   MRN: [id number] Pt here today for LEEP. Pt states that since her last visit she has been very tired and has had no get up and go.

## 2015-04-08 NOTE — Progress Notes (Signed)
Patient ID: Kayla Cervantes, female   DOB: 01/18/1961, 54 y.o.   MRN: [id number] GYNECOLOGY CLINIC PROCEDURE NOTE  Kayla RushCindy D Tonkovich is a 54 y.o. [phone] here for LEEP. No GYN concerns. Pap smear and colposcopy reviewed.   LMP n/a    Pregnancy testn/a postmenopausal  Pap Cin I  Colpo Biopsy no visible dysplasia ECC Cin I, with ?Cin II   Risks, benefits, alternatives, and limitations of procedure explained to patient, including pain, bleeding, infection, failure to remove abnormal tissue and failure to cure dysplasia, need for repeat procedures, damage to pelvic organs, cervical incompetence.  Informed written consent was obtained. All questions were answered. Time out performed.Marland Kitchen.  ??Procedure: The patient was placed in lithotomy position and the bivalved coated speculum was placed in the patient's vagina. Confirmatory repeat Colposcopy  was not done.   A grounding pad placed on the patient.  Lugol's solution was applied to the cervix and areas of decreased uptake were noted around the transformation zone.   Local anesthesia was administered via an intracervical block using 10cc of 2% Lidocaine with epinephrine. The suction was turned on and the  on 50 Watts of cutting current was used to excise the area of decreased uptake and excise the entire transformation zone using a Bovie LLETZ Loop electrode, 15 mm x 9 mm .  ONE pass( was required.   Monsel's solution was then applied and the speculum was removed from the vagina.  Specimen sent to pathology.  ?The patient tolerated the procedure well. Post-operative instructions given to patient, including instruction to seek medical attention for persistent bright red bleeding, fever, abdominal/pelvic pain, dysuria, nausea or vomiting. She was also told about the possibility of having copious yellow to black tinged discharge for weeks. She was counseled to avoid anything in the vagina (sex/douching/tampons) for 3 weeks. She has a 4 week post-operative check to  assess wound healing, review results and discuss further management.

## 2015-04-08 NOTE — Addendum Note (Signed)
Addended by: Richardson ChiquitoRAVIS, [place] Kirchner M on: 04/08/2015 09:38 AM   Modules accepted: Orders

## 2015-04-08 NOTE — Patient Instructions (Signed)
Colposcopy  Colposcopy is a procedure to examine your cervix and vagina, or the area around the outside of your vagina, for abnormalities or signs of disease. The procedure is done using a lighted microscope called a colposcope. Tissue samples may be collected during the colposcopy if your health care provider finds any unusual cells. A colposcopy may be done if a woman has:  · An abnormal Pap test. A Pap test is a medical test done to evaluate cells that are on the surface of the cervix.  · A Pap test result that is suggestive of human papillomavirus (HPV). This virus can cause genital warts and is linked to the development of cervical cancer.  · A sore on her cervix and the results of a Pap test were normal.  · Genital warts on the cervix or in or around the outside of the vagina.  · A mother who took the drug diethylstilbestrol (DES) while pregnant.  · Painful intercourse.  · Vaginal bleeding, especially after sexual intercourse.  LET YOUR HEALTH CARE PROVIDER KNOW ABOUT:  · Any allergies you have.  · All medicines you are taking, including vitamins, herbs, eye drops, creams, and over-the-counter medicines.  · Previous problems you or members of your family have had with the use of anesthetics.  · Any blood disorders you have.  · Previous surgeries you have had.  · Medical conditions you have.  RISKS AND COMPLICATIONS  Generally, a colposcopy is a safe procedure. However, as with any procedure, complications can occur. Possible complications include:  · Bleeding.  · Infection.  · Missed lesions.  BEFORE THE PROCEDURE   · Tell your health care provider if you have your menstrual period. A colposcopy typically is not done during menstruation.  · For 24 hours before the colposcopy, do not:    Douche.    Use tampons.    Use medicines, creams, or suppositories in the vagina.    Have sexual intercourse.  PROCEDURE   During the procedure, you will be lying on your back with your feet in foot rests (stirrups). A warm  metal or plastic instrument (speculum) will be placed in your vagina to keep it open and to allow the health care provider to see the cervix. The colposcope will be placed outside the vagina. It will be used to magnify and examine the cervix, vagina, and the area around the outside of the vagina. A small amount of liquid solution will be placed on the area that is to be viewed. This solution will make it easier to see the abnormal cells. Your health care provider will use tools to suck out mucus and cells from the canal of the cervix. Then he or she will record the location of the abnormal areas.  If a biopsy is done during the procedure, a medicine will usually be given to numb the area (local anesthetic). You may feel mild pain or cramping while the biopsy is done. After the procedure, tissue samples collected during the biopsy will be sent to a lab for analysis.  AFTER THE PROCEDURE   You will be given instructions on when to follow up with your health care provider for your test results. It is important to keep your appointment.     This information is not intended to replace advice given to you by your health care provider. Make sure you discuss any questions you have with your health care provider.     Document Released: 08/08/2002 Document Revised: 01/18/2013 Document Reviewed: 12/15/2012    Elsevier Interactive Patient Education ©2016 [employer] Inc.

## 2015-04-12 ENCOUNTER — Telehealth: Payer: Self-pay | Admitting: Obstetrics and Gynecology

## 2015-04-15 ENCOUNTER — Telehealth: Payer: Self-pay | Admitting: Obstetrics and Gynecology

## 2015-04-15 NOTE — Telephone Encounter (Signed)
Message from Friday already sent to Dr.Ferguson.

## 2015-04-15 NOTE — Telephone Encounter (Signed)
PT TO MAKE POSTOP APPT NEXT Wednesday TO DISCUSS options, hysterectomy vs ckc.

## 2015-04-16 NOTE — Telephone Encounter (Signed)
cIN II ON LEEPSPECIMEN WITH ENDOCERVICAL MARGIN INVOLVED IN DISEASE. OPTIONS OF tvH, OR CKC DISCUSSED. PT TO BE SEEN NEXT WEEK FOR EVAL

## 2015-04-24 ENCOUNTER — Encounter: Payer: Self-pay | Admitting: Obstetrics and Gynecology

## 2015-04-24 ENCOUNTER — Ambulatory Visit (INDEPENDENT_AMBULATORY_CARE_PROVIDER_SITE_OTHER): Payer: BLUE CROSS/BLUE SHIELD | Admitting: Obstetrics and Gynecology

## 2015-04-24 VITALS — BP 130/82 | Ht 64.0 in | Wt 151.0 lb

## 2015-04-24 DIAGNOSIS — N871 Moderate cervical dysplasia: Secondary | ICD-10-CM | POA: Diagnosis not present

## 2015-04-24 NOTE — Progress Notes (Signed)
[facility] Visit  Patient name: Kayla RushCindy D Carneal MRN [id number]  Date of birth: 11/19/1960  CC & HPI:  Kayla Cervantes is a 54 y.o. female presenting today to discuss hysterectomy surgery. 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 She states she would like to go ahead and schedule a surgery for mid December. She denies any issues with bowel movements or urinary symptoms  ROS:  10 Systems reviewed and all are negative for acute change except as noted in the HPI. Vaginal delivery x 3 and cesarean x 1 for last 2 children.    Pertinent History Reviewed:   Reviewed: Significant for abnormal pap  Medical         Past Medical History  Diagnosis Date  . Chest pain 09/22/2011    2D Echo EF>55%  . Hypothyroidism   . Heart valve problem   . Graves disease   . Vaginal Pap smear, abnormal                               Surgical Hx:    Past Surgical History  Procedure Laterality Date  . Cardiac catheterization  12/31/2000    for chest pain EF 60%  . Cesarean section    . Left knee arthroscopy    . Breast surgery      X 5-benign lumpectomies  . Back surgery    . Colonoscopy    . Esophagogastroduodenoscopy    . Colonoscopy N/A 03/18/2015    Procedure: COLONOSCOPY;  Surgeon: Corbin Adeobert M Rourk, MD;  Location: [facility];  Service: Endoscopy;  Laterality: N/A;  10:30 AM   Medications: Reviewed & Updated - see associated section                       Current outpatient prescriptions:  .  acetaminophen (TYLENOL) 500 MG tablet, Take 1,000 mg by mouth every 6 (six) hours as needed for mild pain., Disp: , Rfl:  .  levothyroxine (SYNTHROID, LEVOTHROID) 150 MCG tablet, Take 1 tablet (150 mcg total) by mouth daily before breakfast., Disp: 60 tablet, Rfl: 0   Social History: Reviewed -  reports that she has been smoking Cigarettes.  She has a 35 pack-year smoking history. She has never used smokeless  tobacco.  Objective Findings:  Vitals: Blood pressure 130/82, height 5\' 4"  (1.626 m), weight 151 lb (68.493 kg).  Physical Examination: not indicated  Discussed with pt risks and benefits of vaginal hysterectomy. Pt had opportunity to ask questions and has no further questions at this time.   Greater than 50% was spent in counseling and coordination of care with the patient. Discussion time: 15 minutes    Assessment & Plan:   A:  1. 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 Hysterectomy and possible b/l salpingectomy encouraged, types of hysterectomy discussed---will discuss further for December and appt to have preop physical 2. Metrogel 7.5% QD      By signing my name below, I, Jarvis Morganaylor Horald Birky, attest that this documentation has been prepared under the direction and in the presence of Tilda BurrowJohn Tilden Broz V, MD. Electronically Signed: Jarvis Morganaylor Promise Bushong, ED Scribe. 04/24/2015. 11:24 AM.  I personally performed the services described in this documentation, which was SCRIBED in my presence. The  recorded information has been reviewed and considered accurate. It has been edited as necessary during review. Jonnie Kind, MD

## 2015-04-24 NOTE — Progress Notes (Signed)
Patient ID: Kayla Cervantes, female   DOB: 08-08-1960, 54 y.o.   MRN: [id number] Pt here today for follow up and to decide and schedule her surgery. Pt really wants a complete hysterectomy.

## 2015-05-06 ENCOUNTER — Telehealth: Payer: Self-pay | Admitting: Obstetrics and Gynecology

## 2015-05-06 ENCOUNTER — Ambulatory Visit: Payer: BLUE CROSS/BLUE SHIELD | Admitting: Obstetrics and Gynecology

## 2015-05-06 NOTE — Telephone Encounter (Signed)
Pt will be seen at 13:30 instead of 15:30

## 2015-05-07 ENCOUNTER — Encounter: Payer: Self-pay | Admitting: Obstetrics and Gynecology

## 2015-05-07 ENCOUNTER — Ambulatory Visit (INDEPENDENT_AMBULATORY_CARE_PROVIDER_SITE_OTHER): Payer: BLUE CROSS/BLUE SHIELD | Admitting: Obstetrics and Gynecology

## 2015-05-07 VITALS — BP 110/66 | Ht 64.0 in | Wt 153.0 lb

## 2015-05-07 DIAGNOSIS — D069 Carcinoma in situ of cervix, unspecified: Secondary | ICD-10-CM

## 2015-05-07 DIAGNOSIS — Z01818 Encounter for other preprocedural examination: Secondary | ICD-10-CM | POA: Diagnosis not present

## 2015-05-07 HISTORY — DX: Carcinoma in situ of cervix, unspecified: D06.9

## 2015-05-07 NOTE — Progress Notes (Signed)
Patient ID: Kayla Cervantes, female   DOB: [birth year], 54 y.o.   MRN: [id number] Preoperative History and Physical   Preoperative History and Physical  Kayla Cervantes is a 54 y.o. [id number] here for surgical management of CIN III with involved surgical margin s/p LEEP.   No significant preoperative concerns.  Proposed surgery: Abdominal Hysterectomy , bilateral salpingoophorectomy, wide excision of cicatrix  Past Medical History  Diagnosis Date  . Chest pain 09/22/2011    2D Echo EF>55%  . Hypothyroidism   . Heart valve problem   . Graves disease   . Vaginal Pap smear, abnormal    Past Surgical History  Procedure Laterality Date  . Cardiac catheterization  12/31/2000    for chest pain EF 60%  . Cesarean section    . Left knee arthroscopy    . Breast surgery      X 5-benign lumpectomies  . Back surgery    . Colonoscopy    . Esophagogastroduodenoscopy    . Colonoscopy N/A 03/18/2015    Procedure: COLONOSCOPY;  Surgeon: Corbin Adeobert M Rourk, MD;  [facility];  Service: Endoscopy;  Laterality: N/A;  10:30 AM   OB History  Gravida Para Term Preterm AB SAB TAB Ectopic Multiple Living  4 4 2 2      4     # Outcome Date GA Lbr Len/2nd Weight Sex Delivery Anes PTL Lv  4 Preterm           3 Preterm           2 Term           1 Term             Patient denies any other pertinent gynecologic issues.   Current Outpatient Prescriptions on File Prior to Visit  Medication Sig Dispense Refill  . acetaminophen (TYLENOL) 500 MG tablet Take 1,000 mg by mouth every 6 (six) hours as needed for mild pain.    Marland Kitchen. levothyroxine (SYNTHROID, LEVOTHROID) 175 MCG tablet Take 1 tablet by mouth daily.     No current facility-administered medications on file prior to visit.   No Known Allergies  Social History:   reports that she has been smoking Cigarettes.  She has a 35 pack-year smoking history. She has never used smokeless tobacco. She reports that she does not drink alcohol or use illicit  drugs.  Family History  Problem Relation Age of Onset  . Heart disease Mother 5280    lung disease ( at [facility])  . Heart attack Father 2438    deceased  . Stroke Maternal Grandmother     deceased, heart failure  . Diabetes Paternal Grandmother 5079    deceased  . Cancer Paternal Grandfather 3580    deceased  . Colon cancer Paternal Grandfather   . Thyroid disease Brother 219    deceased bleeding at surgery    Review of Systems: Noncontributory  PHYSICAL EXAM: Blood pressure 110/66, height 5\' 4"  (1.626 m), weight 69.4 kg (153 lb). General appearance - alert, well appearing, and in no distress Chest - clear to auscultation, no wheezes, rales or rhonchi, symmetric air entry Heart - normal rate and regular rhythm Abdomen - soft, nontender, nondistended, no masses or organomegaly Pelvic - examination not indicated Extremities - peripheral pulses normal, no pedal edema, no clubbing or cyanosis  Labs: No results found for this or any previous visit (from the past 336 hour(s)).  Imaging Studies: No results found.  Assessment: Patient Active Problem  List   Diagnosis Date Noted  . ? ofCIN II (cervical intraepithelial neoplasia II) on ECC 04/08/2015  . HSIL (high grade squamous intraepithelial lesion) on Pap smear of cervix 03/30/2015  . Special screening for malignant neoplasms, colon   . Other chest pain   . Chest pain 01/14/2015  . Hypothyroidism 01/14/2015  . Tobacco use disorder 01/14/2015    Plan: Patient will undergo surgical management with Abdominal Hysterectomy, bilateral salpingoophorectomy, wide excision of abdominal cicatrix..    .mec 05/07/2015 2:49 PM

## 2015-05-07 NOTE — Progress Notes (Signed)
Patient ID: Kayla RushCindy D Hugill, female   DOB: 19-Nov-1960, 54 y.o.   MRN: [id number] Pt here today for pre op visit. Pt already has surgery scheduled.

## 2015-05-10 ENCOUNTER — Encounter (HOSPITAL_COMMUNITY)
Admission: RE | Admit: 2015-05-10 | Discharge: 2015-05-10 | Disposition: A | Discharge status: Home / Self Care | Payer: BLUE CROSS/BLUE SHIELD | Source: Ambulatory Visit | Attending: Obstetrics and Gynecology | Admitting: Obstetrics and Gynecology

## 2015-05-10 ENCOUNTER — Other Ambulatory Visit: Payer: Self-pay | Admitting: Obstetrics and Gynecology

## 2015-05-10 ENCOUNTER — Encounter (HOSPITAL_COMMUNITY): Payer: Self-pay

## 2015-05-10 DIAGNOSIS — Z01818 Encounter for other preprocedural examination: Secondary | ICD-10-CM | POA: Diagnosis not present

## 2015-05-10 DIAGNOSIS — D069 Carcinoma in situ of cervix, unspecified: Secondary | ICD-10-CM | POA: Insufficient documentation

## 2015-05-10 DIAGNOSIS — Q899 Congenital malformation, unspecified: Secondary | ICD-10-CM | POA: Diagnosis not present

## 2015-05-10 LAB — URINALYSIS, ROUTINE W REFLEX MICROSCOPIC
Bilirubin Urine: NEGATIVE
GLUCOSE, UA: NEGATIVE mg/dL
HGB URINE DIPSTICK: NEGATIVE
Ketones, ur: NEGATIVE mg/dL
Nitrite: NEGATIVE
PH: 7 (ref 5.0–8.0)
PROTEIN: NEGATIVE mg/dL
Specific Gravity, Urine: <1.005 — ABNORMAL LOW (ref 1.005–1.030)

## 2015-05-10 LAB — URINE MICROSCOPIC-ADD ON: RBC / HPF: NONE SEEN RBC/hpf (ref 0–5)

## 2015-05-10 LAB — CBC
HEMATOCRIT: 41.3 % (ref 36.0–46.0)
Hemoglobin: 14.4 g/dL (ref 12.0–15.0)
MCH: 32.3 pg (ref 26.0–34.0)
MCHC: 34.9 g/dL (ref 30.0–36.0)
MCV: 92.6 fL (ref 78.0–100.0)
PLATELETS: 268 10*3/uL (ref 150–400)
RBC: 4.46 MIL/uL (ref 3.87–5.11)
RDW: 13.3 % (ref 11.5–15.5)
WBC: 6 10*3/uL (ref 4.0–10.5)

## 2015-05-10 LAB — TYPE AND SCREEN
ABO/RH(D): A POS
Antibody Screen: NEGATIVE

## 2015-05-10 LAB — COMPREHENSIVE METABOLIC PANEL
ALK PHOS: 86 U/L (ref 38–126)
ALT: 21 U/L (ref 14–54)
AST: 20 U/L (ref 15–41)
Albumin: 3.9 g/dL (ref 3.5–5.0)
Anion gap: 7 (ref 5–15)
BILIRUBIN TOTAL: 0.4 mg/dL (ref 0.3–1.2)
BUN: 12 mg/dL (ref 6–20)
CALCIUM: 9.4 mg/dL (ref 8.9–10.3)
CO2: 28 mmol/L (ref 22–32)
CREATININE: 0.61 mg/dL (ref 0.44–1.00)
Chloride: 104 mmol/L (ref 101–111)
Glucose, Bld: 99 mg/dL (ref 65–99)
Potassium: 4.6 mmol/L (ref 3.5–5.1)
Sodium: 139 mmol/L (ref 135–145)
TOTAL PROTEIN: 6.8 g/dL (ref 6.5–8.1)

## 2015-05-10 LAB — SURGICAL PCR SCREEN
MRSA, PCR: NEGATIVE
Staphylococcus aureus: NEGATIVE

## 2015-05-10 NOTE — Patient Instructions (Signed)
Kayla AspCindy D Cervantes  05/10/2015     @PREFPERIOPPHARMACY @   Your procedure is scheduled on 05/14/15.  [name].  Call this number if you have problems the morning of surgery:  [phone]   Remember:  Do not eat food or drink liquids after midnight.  Take these medicines the morning of surgery with A SIP OF WATER Synthroid   Do not wear jewelry, make-up or nail polish.  Do not wear lotions, powders, or perfumes.  You may wear deodorant.  Do not shave 48 hours prior to surgery.  Men may shave face and neck.  Do not bring valuables to the hospital.  [facility] any belongings or valuables.  Contacts, dentures or bridgework may not be worn into surgery.  Leave your suitcase in the car.  After surgery it may be brought to your room.  For patients admitted to the hospital, discharge time will be determined by your treatment team.  Patients discharged the day of surgery will not be allowed to drive home.   Please read over the following fact sheets that you were given. Surgical Site Infection Prevention and Anesthesia Post-op Instructions     PATIENT INSTRUCTIONS POST-ANESTHESIA  IMMEDIATELY FOLLOWING SURGERY:  Do not drive or operate machinery for the first twenty four hours after surgery.  Do not make any important decisions for twenty four hours after surgery or while taking narcotic pain medications or sedatives.  If you develop intractable nausea and vomiting or a severe headache please notify your doctor immediately.  FOLLOW-UP:  Please make an appointment with your surgeon as instructed. You do not need to follow up with anesthesia unless specifically instructed to do so.  WOUND CARE INSTRUCTIONS (if applicable):  Keep a dry clean dressing on the anesthesia/puncture wound site if there is drainage.  Once the wound has quit draining you may leave it open to air.  Generally you should leave the bandage intact for twenty four hours  unless there is drainage.  If the epidural site drains for more than 36-48 hours please call the anesthesia department.  QUESTIONS?:  Please feel free to call your physician or the hospital operator if you have any questions, and they will be happy to assist you.      Bilateral Salpingo-Oophorectomy Bilateral salpingo-oophorectomy is the surgical removal of both fallopian tubes and both ovaries. The ovaries are small organs that produce eggs in women. The fallopian tubes transport the egg from the ovary to the womb (uterus). Usually, when this surgery is done, the uterus was previously removed. A bilateral salpingo-oophorectomy may be done to treat cancer or to reduce the risk of cancer in women who are at high risk. Removing both fallopian tubes and both ovaries will make you unable to become pregnant (sterile). It will also put you into menopause so that you will no longer have menstrual periods and may have menopausal symptoms such as hot flashes, night sweats, and mood changes. It will not affect your sex drive. LET [facility] PROVIDER KNOW ABOUT:  Any allergies you have.  All medicines you are taking, including vitamins, herbs, eye drops, creams, and over-the-counter medicines.  Previous problems you or members of your family have had with the use of anesthetics.  Any blood disorders you have.  Previous surgeries you have had.  Medical conditions you have. RISKS AND COMPLICATIONS Generally, this is a safe procedure. However, as with any procedure, complications can occur. Possible complications  include:  Injury to surrounding organs.  Bleeding.  Infection.  Blood clots in the legs or lungs.  Problems related to anesthesia. BEFORE THE PROCEDURE  Ask your health care provider about changing or stopping your regular medicines. You may need to stop taking certain medicines, such as aspirin or blood thinners, at least 1 week before the surgery.  Do not eat or drink anything  for at least 8 hours before the surgery.  If you smoke, do not smoke for at least 2 weeks before the surgery.  Make plans to have someone drive you home after the procedure or after your hospital stay. Also arrange for someone to help you with activities during recovery. PROCEDURE   You will be given medicine to help you relax before the procedure (sedative). You will then be given medicine to make you sleep through the procedure (general anesthetic). These medicines will be given through an IV access tube that is put into one of your veins.  Once you are asleep, your lower abdomen will be shaved and cleaned. A thin, flexible tube (catheter) will be placed in your bladder.  The surgeon may use a laparoscopic, robotic, or open technique for this surgery:  In the laparoscopic technique, the surgery is done through two small cuts (incisions) in the abdomen. A thin, lighted tube with a tiny camera on the end (laparoscope) is inserted into one of the incisions. The tools needed for the procedure are put through the other incision.  A robotic technique may be chosen to perform complex surgery in a small space. In the robotic technique, small incisions will be made. A camera and surgical instruments are passed through the incisions. Surgical instruments will be controlled with the help of a robotic arm.  In the open technique, the surgery is done through one large incision in the abdomen.  Using any of these techniques, the surgeon removes the fallopian tubes and ovaries. The blood vessels will be clamped and tied.  The surgeon then uses staples or stitches to close the incision or incisions. AFTER THE PROCEDURE  You will be taken to a recovery area where you will be monitored for 1 to 3 hours. Your blood pressure, pulse, and temperature will be checked often. You will remain in the recovery area until you are stable and waking up.  If the laparoscopic technique was used, you may be allowed to go  home after several hours. You may have some shoulder pain after the laparoscopic procedure. This is normal and usually goes away in a day or two.  If the open technique was used, you will be admitted to the hospital for a couple of days.  You will be given pain medicine as needed.  The IV access tube and catheter will be removed before you are discharged.   This information is not intended to replace advice given to you by your health care provider. Make sure you discuss any questions you have with your health care provider.   Document Released: 05/18/2005 Document Revised: 05/23/2013 Document Reviewed: 11/09/2012 Elsevier Interactive Patient Education 2016 Else[employer] Inc[employer] Abdominal Hysterectomy Abdominal hysterectomy is a surgical procedure to remove your womb (uterus). Your uterus is the muscular organ that contains a developing baby. This surgery is done for many reasons. You may need an abdominal hysterectomy if you have cancer, growths (tumors), long-term pain, or bleeding. You may also have this procedure if your uterus has slipped down into your vagina (uterine prolapse). Depending on why you need an abdominal  hysterectomy, you may also have other reproductive organs removed. These could include the part of your vagina that connects with your uterus (cervix), the organs that make eggs (ovaries), and the tubes that connect the ovaries to the uterus (fallopian tubes)[facility] KNOW ABOUT:   Any allergies you have.  All medicines you are taking, including vitamins, herbs, eye drops, creams, and over-the-counter medicines.  Previous problems you or members of your family have had with the use of anesthetics.  Any blood disorders you have.  Previous surgeries you have had.  Medical conditions you have. RISKS AND COMPLICATIONS Generally, this is a safe procedure. However, as with any procedure, problems can occur. Infection is the most common problem after an  abdominal hysterectomy. Other possible problems include:  Bleeding.  Formation of blood clots that may break free and travel to your lungs.  Injury to other organs near your uterus.  Nerve injury causing nerve pain.  Decreased interest in sex or pain during sexual intercourse. BEFORE THE PROCEDURE  Abdominal hysterectomy is a major surgical procedure. It can affect the way you feel about yourself. Talk to your health care provider about the physical and emotional changes hysterectomy may cause.  You may need to have blood work and X-rays done before surgery.  Quit smoking if you smoke. Ask your health care provider for help if you are struggling to quit.  Stop taking medicines that thin your blood as directed by your health care provider.  You may be instructed to take antibiotic medicines or laxatives before surgery.  Do not eat or drink anything for 6-8 hours before surgery.  Take your regular medicines with a small sip of water.  Bathe or shower the night or morning before surgery. PROCEDURE  Abdominal hysterectomy is done in the operating room at the hospital.  In most cases, you will be given a medicine that makes you go to sleep (general anesthetic).  The surgeon will make a cut (incision) through the skin in your lower belly.  The incision may be about 5-7 inches long. It may go side-to-side or up-and-down.  The surgeon will move aside the body tissue that covers your uterus. The surgeon will then carefully take out your uterus along with any of your other reproductive organs that need to be removed.  Bleeding will be controlled with clamps or sutures.  The surgeon will close your incision with sutures or metal clips. AFTER THE PROCEDURE  You will have some pain immediately after the procedure.  You will be given pain medicine in the recovery room.  You will be taken to your hospital room when you have recovered from the anesthesia.  You may need to stay in  the hospital for 2-5 days.  You will be given instructions for recovery at home.   This information is not intended to replace advice given to you by your health care provider. Make sure you discuss any questions you have with your health care provider.   Document Released: 05/23/2013 Document Reviewed: 05/23/2013 Elsevier Interactive Patient Education [employer]

## 2015-05-10 NOTE — H&P (Unsigned)
Kayla BurrowJohn Christinia Lambeth V, MD at 05/07/2015 2:49 PM     Status: Signed       Expand All Collapse All   Patient ID: Kayla Cervantes, female DOB: [birth year], 54 y.o. MRN: [id number] Preoperative History and Physical   Preoperative History and Physical  Kayla Cervantes is a 54 y.o. [id number] here for surgical management of CIN III with involved surgical margin s/p LEEP.The patient has had review of surgical options, and alternatives. The patient has requested removal of both tubes and ovaries after discussion of individual pro and con of tube removal, and ovary removal, with specific consideration of the effect of removal on tube/ovary risk reduction. The patient is postmenopausal.The patient had a prior cesarean , and has some abdominal laxity in area surrounding this scar, so abdominal approach best meets her requested solutions. No significant preoperative concerns.  Proposed surgery: Abdominal Hysterectomy , bilateral salpingoophorectomy, wide excision of cicatrix  Past Medical History  Diagnosis Date  . Chest pain 09/22/2011    2D Echo EF>55%  . Hypothyroidism   . Heart valve problem   . Graves disease   . Vaginal Pap smear, abnormal    Past Surgical History  Procedure Laterality Date  . Cardiac catheterization  12/31/2000    for chest pain EF 60%  . Cesarean section    . Left knee arthroscopy    . Breast surgery      X 5-benign lumpectomies  . Back surgery    . Colonoscopy    . Esophagogastroduodenoscopy    . Colonoscopy N/A 03/18/2015    Procedure: COLONOSCOPY; Surgeon: Corbin Adeobert M Rourk, MD; Location[facility]; Service: Endoscopy; Laterality: N/A; 10:30 AM   OB History  Gravida Para Term Preterm AB SAB TAB Ectopic Multiple Living  4 4 2 2      4     # Outcome Date GA Lbr Len/2nd Weight Sex Delivery Anes PTL Lv  4 Preterm           3 Preterm            2 Term           1 Term             Patient denies any other pertinent gynecologic issues.   Current Outpatient Prescriptions on File Prior to Visit  Medication Sig Dispense Refill  . acetaminophen (TYLENOL) 500 MG tablet Take 1,000 mg by mouth every 6 (six) hours as needed for mild pain.    Marland Kitchen. levothyroxine (SYNTHROID, LEVOTHROID) 175 MCG tablet Take 1 tablet by mouth daily.     No current facility-administered medications on file prior to visit.   No Known Allergies  Social History:  reports that she has been smoking Cigarettes. She has a 35 pack-year smoking history. She has never used smokeless tobacco. She reports that she does not drink alcohol or use illicit drugs.  Family History  Problem Relation Age of Onset  . Heart disease Mother 7680    [facility] ( [facility]  . Heart attack Father 2738    deceased  . Stroke Maternal Grandmother     deceased, heart failure  . Diabetes Paternal Grandmother 4079    deceased  . Cancer Paternal Grandfather 280    deceased  . Colon cancer Paternal Grandfather   . Thyroid disease Brother 1919    deceased bleeding at surgery    Review of Systems: Noncontributory  PHYSICAL EXAM: Blood pressure 110/66, height 5'  4" (1.626 m), weight 69.4 kg (153 lb). General appearance - alert, well appearing, and in no distress Chest - clear to auscultation, no wheezes, rales or rhonchi, symmetric air entry Heart - normal rate and regular rhythm Abdomen - soft, nontender, nondistended, no masses or organomegaly Pelvic - examination not indicated Extremities - peripheral pulses normal, no pedal edema, no clubbing or cyanosis  Labs: No results found for this or any previous visit (from the past 336 hour(s)).  Imaging Studies:  Imaging Results    No results found.    Assessment: Patient Active Problem List   Diagnosis Date Noted  . ?  ofCIN II (cervical intraepithelial neoplasia II) on ECC 04/08/2015  . HSIL (high grade squamous intraepithelial lesion) on Pap smear of cervix 03/30/2015  . Special screening for malignant neoplasms, colon   . Other chest pain   . Chest pain 01/14/2015  . Hypothyroidism 01/14/2015  . Tobacco use disorder 01/14/2015    Plan: Patient will undergo surgical management with Abdominal Hysterectomy, bilateral salpingoophorectomy, wide excision of abdominal cicatrix..    .mec 05/07/2015 2:49 PM

## 2015-05-13 NOTE — H&P (Signed)
Progress Notes Info    Chartered loss adjuster Note Status Last Update User Last Update Date/Time   [name], MD Signed [name], MD 05/07/2015 6:18 PM    Progress Notes    Expand All Collapse All   Patient ID: Kayla Cervantes, female DOB: 1961/03/16, 54 y.o. MRN: [id number] Preoperative History and Physical   Preoperative History and Physical  Kayla Cervantes is a 54 y.o. [id number] here for surgical management of CIN III with involved surgical margin s/p LEEP. No significant preoperative concerns.  Proposed surgery: Abdominal Hysterectomy , bilateral salpingoophorectomy, wide excision of cicatrix  Past Medical History  Diagnosis Date  . Chest pain 09/22/2011    2D Echo EF>55%  . Hypothyroidism   . Heart valve problem   . Graves disease   . Vaginal Pap smear, abnormal    Past Surgical History  Procedure Laterality Date  . Cardiac catheterization  12/31/2000    for chest pain EF 60%  . Cesarean section    . Left knee arthroscopy    . Breast surgery      X 5-benign lumpectomies  . Back surgery    . Colonoscopy    . Esophagogastroduodenoscopy    . Colonoscopy N/A 03/18/2015    Procedure: COLONOSCOPY; Surgeon: Corbin Ade, MD; [facility]; Service: Endoscopy; Laterality: N/A; 10:30 AM   OB History  Gravida Para Term Preterm AB SAB TAB Ectopic Multiple Living  # Outcome Date GA Lbr Len/2nd Weight Sex Delivery Anes PTL Lv  4 Preterm           3 Preterm           2 Term           1 Term             Patient denies any other pertinent gynecologic issues.   Current Outpatient Prescriptions on File Prior to Visit  Medication Sig Dispense Refill  . acetaminophen (TYLENOL) 500 MG tablet Take 1,000 mg by mouth every 6 (six) hours as needed for mild pain.    Marland Kitchen levothyroxine  (SYNTHROID, LEVOTHROID) 175 MCG tablet Take 1 tablet by mouth daily.     No current facility-administered medications on file prior to visit.   No Known Allergies  Social History:  reports that she has been smoking Cigarettes. She has a 35 pack-year smoking history. She has never used smokeless tobacco. She reports that she does not drink alcohol or use illicit drugs.  Family History  Problem Relation Age of Onset  . Heart disease Mother 20    lung disease ( at [facility])  . Heart attack Father 41    deceased  . Stroke Maternal Grandmother     deceased, heart failure  . Diabetes Paternal Grandmother 46    deceased  . Cancer Paternal Grandfather 49    deceased  . Colon cancer Paternal Grandfather   . Thyroid disease Brother 38    deceased bleeding at surgery    Review of Systems: Noncontributory  PHYSICAL EXAM: Blood pressure 110/66, height  (1.626 m), weight 69.4 kg (153 lb). General appearance - alert, well appearing, and in no distress Chest - clear to auscultation, no wheezes, rales or rhonchi, symmetric air entry Heart - normal rate and regular rhythm Abdomen - soft, nontender, nondistended, no masses or organomegaly Pelvic - examination not indicated Extremities - peripheral pulses normal, no pedal edema, no  clubbing or cyanosis  Labs: No results found for this or any previous visit (from the past 336 hour(s)).  Imaging Studies:  Imaging Results    No results found.    Assessment: Patient Active Problem List   Diagnosis Date Noted  . ? ofCIN II (cervical intraepithelial neoplasia II) on ECC 04/08/2015  . HSIL (high grade squamous intraepithelial lesion) on Pap smear of cervix 03/30/2015  . Special screening for malignant neoplasms, colon   . Other chest pain   . Chest pain 01/14/2015  . Hypothyroidism 01/14/2015  . Tobacco use disorder 01/14/2015    Plan: Patient  will undergo surgical management with Abdominal Hysterectomy, bilateral salpingoophorectomy, wide excision of abdominal cicatrix..    .mec 05/07/2015 2:49 PM

## 2015-05-14 ENCOUNTER — Encounter (HOSPITAL_COMMUNITY)
Admission: RE | Disposition: A | Discharge status: Home / Self Care | Payer: Self-pay | Source: Ambulatory Visit | Attending: Obstetrics and Gynecology

## 2015-05-14 ENCOUNTER — Ambulatory Visit (HOSPITAL_COMMUNITY): Payer: BLUE CROSS/BLUE SHIELD | Admitting: Anesthesiology

## 2015-05-14 ENCOUNTER — Observation Stay (HOSPITAL_COMMUNITY)
Admission: RE | Admit: 2015-05-14 | Discharge: 2015-05-15 | Disposition: A | Discharge status: Home / Self Care | Payer: BLUE CROSS/BLUE SHIELD | Source: Ambulatory Visit | Attending: Obstetrics and Gynecology | Admitting: Obstetrics and Gynecology

## 2015-05-14 ENCOUNTER — Encounter (HOSPITAL_COMMUNITY): Payer: Self-pay | Admitting: *Deleted

## 2015-05-14 DIAGNOSIS — Z01812 Encounter for preprocedural laboratory examination: Secondary | ICD-10-CM | POA: Insufficient documentation

## 2015-05-14 DIAGNOSIS — Z90710 Acquired absence of both cervix and uterus: Secondary | ICD-10-CM | POA: Diagnosis present

## 2015-05-14 DIAGNOSIS — E039 Hypothyroidism, unspecified: Secondary | ICD-10-CM | POA: Diagnosis not present

## 2015-05-14 DIAGNOSIS — D069 Carcinoma in situ of cervix, unspecified: Secondary | ICD-10-CM | POA: Diagnosis present

## 2015-05-14 DIAGNOSIS — R87613 High grade squamous intraepithelial lesion on cytologic smear of cervix (HGSIL): Principal | ICD-10-CM | POA: Insufficient documentation

## 2015-05-14 DIAGNOSIS — Z79899 Other long term (current) drug therapy: Secondary | ICD-10-CM | POA: Insufficient documentation

## 2015-05-14 DIAGNOSIS — N871 Moderate cervical dysplasia: Secondary | ICD-10-CM | POA: Diagnosis not present

## 2015-05-14 DIAGNOSIS — N840 Polyp of corpus uteri: Secondary | ICD-10-CM | POA: Diagnosis not present

## 2015-05-14 DIAGNOSIS — Z029 Encounter for administrative examinations, unspecified: Secondary | ICD-10-CM

## 2015-05-14 DIAGNOSIS — Z90721 Acquired absence of ovaries, unilateral: Secondary | ICD-10-CM

## 2015-05-14 DIAGNOSIS — F17200 Nicotine dependence, unspecified, uncomplicated: Secondary | ICD-10-CM | POA: Diagnosis not present

## 2015-05-14 HISTORY — PX: SCAR REVISION: SHX5285

## 2015-05-14 HISTORY — PX: ABDOMINAL HYSTERECTOMY: SHX81

## 2015-05-14 HISTORY — PX: SALPINGOOPHORECTOMY: SHX82

## 2015-05-14 SURGERY — HYSTERECTOMY, VAGINAL
Anesthesia: General

## 2015-05-14 SURGERY — HYSTERECTOMY, ABDOMINAL
Anesthesia: General | Site: Abdomen

## 2015-05-14 MED ORDER — PROPOFOL 10 MG/ML IV BOLUS
INTRAVENOUS | Status: AC
  Filled 2015-05-14: qty 20

## 2015-05-14 MED ORDER — ONDANSETRON HCL 4 MG/2ML IJ SOLN
4.0000 mg | Freq: Once | INTRAMUSCULAR | Status: AC
  Administered 2015-05-14: 4 mg via INTRAVENOUS

## 2015-05-14 MED ORDER — DIPHENHYDRAMINE HCL 50 MG/ML IJ SOLN: 12.5000 mg | Freq: Four times a day (QID) | INTRAMUSCULAR | Status: DC | PRN

## 2015-05-14 MED ORDER — MIDAZOLAM HCL 2 MG/2ML IJ SOLN
1.0000 mg | INTRAMUSCULAR | Status: DC | PRN
Start: 2015-05-14 — End: 2015-05-14
  Administered 2015-05-14: 2 mg via INTRAVENOUS

## 2015-05-14 MED ORDER — PHENYLEPHRINE 40 MCG/ML (10ML) SYRINGE FOR IV PUSH (FOR BLOOD PRESSURE SUPPORT)
PREFILLED_SYRINGE | INTRAVENOUS | Status: AC
  Filled 2015-05-14: qty 10

## 2015-05-14 MED ORDER — DEXAMETHASONE SODIUM PHOSPHATE 4 MG/ML IJ SOLN
INTRAMUSCULAR | Status: AC
  Filled 2015-05-14: qty 1

## 2015-05-14 MED ORDER — OXYCODONE-ACETAMINOPHEN 5-325 MG PO TABS
1.0000 | ORAL_TABLET | ORAL | Status: DC | PRN
  Administered 2015-05-14 (×2): 1 via ORAL
  Administered 2015-05-15: 2 via ORAL
  Administered 2015-05-15: 1 via ORAL
  Filled 2015-05-14: qty 1
  Filled 2015-05-14: qty 2
  Filled 2015-05-14 (×2): qty 1

## 2015-05-14 MED ORDER — SODIUM CHLORIDE 0.9 % IV SOLN
INTRAVENOUS | Status: DC
Start: 2015-05-14 — End: 2015-05-15
  Administered 2015-05-14 (×2): via INTRAVENOUS
  Administered 2015-05-15: 1 mL via INTRAVENOUS

## 2015-05-14 MED ORDER — PANTOPRAZOLE SODIUM 40 MG PO TBEC
40.0000 mg | DELAYED_RELEASE_TABLET | Freq: Every day | ORAL | Status: DC
  Administered 2015-05-15: 40 mg via ORAL
  Filled 2015-05-14: qty 1

## 2015-05-14 MED ORDER — LEVOTHYROXINE SODIUM 75 MCG PO TABS
175.0000 ug | ORAL_TABLET | Freq: Every day | ORAL | Status: DC
  Administered 2015-05-15: 175 ug via ORAL
  Filled 2015-05-14 (×2): qty 1

## 2015-05-14 MED ORDER — CEFAZOLIN SODIUM-DEXTROSE 2-3 GM-% IV SOLR
2.0000 g | INTRAVENOUS | Status: AC
  Administered 2015-05-14: 2 g via INTRAVENOUS

## 2015-05-14 MED ORDER — KETOROLAC TROMETHAMINE 30 MG/ML IJ SOLN
30.0000 mg | Freq: Four times a day (QID) | INTRAMUSCULAR | Status: DC
  Filled 2015-05-14 (×2): qty 1

## 2015-05-14 MED ORDER — 0.9 % SODIUM CHLORIDE (POUR BTL) OPTIME
TOPICAL | Status: DC | PRN
  Administered 2015-05-14 (×2): 1000 mL

## 2015-05-14 MED ORDER — FENTANYL CITRATE (PF) 250 MCG/5ML IJ SOLN
INTRAMUSCULAR | Status: AC
  Filled 2015-05-14: qty 5

## 2015-05-14 MED ORDER — ONDANSETRON HCL 4 MG/2ML IJ SOLN: 4.0000 mg | Freq: Four times a day (QID) | INTRAMUSCULAR | Status: DC | PRN

## 2015-05-14 MED ORDER — SODIUM CHLORIDE 0.9 % IJ SOLN: 9.0000 mL | INTRAMUSCULAR | Status: DC | PRN

## 2015-05-14 MED ORDER — DIPHENHYDRAMINE HCL 12.5 MG/5ML PO ELIX: 12.5000 mg | ORAL_SOLUTION | Freq: Four times a day (QID) | ORAL | Status: DC | PRN

## 2015-05-14 MED ORDER — KETOROLAC TROMETHAMINE 30 MG/ML IJ SOLN
30.0000 mg | Freq: Once | INTRAMUSCULAR | Status: AC
  Administered 2015-05-14: 30 mg via INTRAVENOUS
  Filled 2015-05-14: qty 1

## 2015-05-14 MED ORDER — GLYCOPYRROLATE 0.2 MG/ML IJ SOLN
INTRAMUSCULAR | Status: DC | PRN
  Administered 2015-05-14: .6 mg via INTRAVENOUS

## 2015-05-14 MED ORDER — ONDANSETRON HCL 4 MG PO TABS: 4.0000 mg | ORAL_TABLET | Freq: Four times a day (QID) | ORAL | Status: DC | PRN

## 2015-05-14 MED ORDER — ROCURONIUM BROMIDE 100 MG/10ML IV SOLN
INTRAVENOUS | Status: DC | PRN
  Administered 2015-05-14: 15 mg via INTRAVENOUS
  Administered 2015-05-14: 10 mg via INTRAVENOUS
  Administered 2015-05-14: 35 mg via INTRAVENOUS

## 2015-05-14 MED ORDER — PROPOFOL 10 MG/ML IV BOLUS
INTRAVENOUS | Status: DC | PRN
  Administered 2015-05-14: 160 mg via INTRAVENOUS
  Administered 2015-05-14: 40 mg via INTRAVENOUS

## 2015-05-14 MED ORDER — MIDAZOLAM HCL 2 MG/2ML IJ SOLN
INTRAMUSCULAR | Status: AC
  Filled 2015-05-14: qty 2

## 2015-05-14 MED ORDER — HYDROMORPHONE HCL 1 MG/ML IJ SOLN
1.0000 mg | INTRAMUSCULAR | Status: DC | PRN
  Administered 2015-05-14: 1 mg via INTRAVENOUS
  Filled 2015-05-14: qty 2

## 2015-05-14 MED ORDER — ROCURONIUM BROMIDE 50 MG/5ML IV SOLN
INTRAVENOUS | Status: AC
  Filled 2015-05-14: qty 1

## 2015-05-14 MED ORDER — FENTANYL CITRATE (PF) 100 MCG/2ML IJ SOLN
25.0000 ug | INTRAMUSCULAR | Status: DC | PRN
Start: 2015-05-14 — End: 2015-05-14
  Administered 2015-05-14 (×3): 50 ug via INTRAVENOUS
  Filled 2015-05-14 (×2): qty 2

## 2015-05-14 MED ORDER — NEOSTIGMINE METHYLSULFATE 10 MG/10ML IV SOLN
INTRAVENOUS | Status: AC
  Filled 2015-05-14: qty 1

## 2015-05-14 MED ORDER — PHENYLEPHRINE HCL 10 MG/ML IJ SOLN
INTRAMUSCULAR | Status: DC | PRN
  Administered 2015-05-14 (×2): 40 ug via INTRAVENOUS

## 2015-05-14 MED ORDER — CEFAZOLIN SODIUM-DEXTROSE 2-3 GM-% IV SOLR
INTRAVENOUS | Status: AC
  Filled 2015-05-14: qty 50

## 2015-05-14 MED ORDER — DEXAMETHASONE SODIUM PHOSPHATE 4 MG/ML IJ SOLN
4.0000 mg | Freq: Once | INTRAMUSCULAR | Status: AC
  Administered 2015-05-14: 4 mg via INTRAVENOUS

## 2015-05-14 MED ORDER — KETOROLAC TROMETHAMINE 30 MG/ML IJ SOLN
30.0000 mg | Freq: Four times a day (QID) | INTRAMUSCULAR | Status: DC
  Administered 2015-05-14 – 2015-05-15 (×5): 30 mg via INTRAVENOUS
  Filled 2015-05-14 (×5): qty 1

## 2015-05-14 MED ORDER — BUPIVACAINE HCL (PF) 0.5 % IJ SOLN
INTRAMUSCULAR | Status: AC
  Filled 2015-05-14: qty 30

## 2015-05-14 MED ORDER — HYDROMORPHONE 1 MG/ML IV SOLN
INTRAVENOUS | Status: AC
  Filled 2015-05-14: qty 25

## 2015-05-14 MED ORDER — NALOXONE HCL 0.4 MG/ML IJ SOLN: 0.4000 mg | INTRAMUSCULAR | Status: DC | PRN

## 2015-05-14 MED ORDER — NEOSTIGMINE METHYLSULFATE 10 MG/10ML IV SOLN
INTRAVENOUS | Status: DC | PRN
  Administered 2015-05-14: 3 mg via INTRAVENOUS

## 2015-05-14 MED ORDER — ONDANSETRON HCL 4 MG/2ML IJ SOLN
INTRAMUSCULAR | Status: AC
  Filled 2015-05-14: qty 2

## 2015-05-14 MED ORDER — IBUPROFEN 600 MG PO TABS: 600.0000 mg | ORAL_TABLET | Freq: Four times a day (QID) | ORAL | Status: DC | PRN

## 2015-05-14 MED ORDER — GLYCOPYRROLATE 0.2 MG/ML IJ SOLN
INTRAMUSCULAR | Status: AC
  Filled 2015-05-14: qty 3

## 2015-05-14 MED ORDER — SODIUM CHLORIDE 0.9 % IJ SOLN
INTRAMUSCULAR | Status: AC
  Filled 2015-05-14: qty 10

## 2015-05-14 MED ORDER — LIDOCAINE HCL (PF) 1 % IJ SOLN
INTRAMUSCULAR | Status: AC
  Filled 2015-05-14: qty 5

## 2015-05-14 MED ORDER — ONDANSETRON HCL 4 MG/2ML IJ SOLN: 4.0000 mg | Freq: Once | INTRAMUSCULAR | Status: DC | PRN

## 2015-05-14 MED ORDER — FENTANYL CITRATE (PF) 250 MCG/5ML IJ SOLN
INTRAMUSCULAR | Status: DC | PRN
  Administered 2015-05-14: 50 ug via INTRAVENOUS
  Administered 2015-05-14: 100 ug via INTRAVENOUS
  Administered 2015-05-14 (×5): 50 ug via INTRAVENOUS

## 2015-05-14 MED ORDER — LIDOCAINE HCL (CARDIAC) 20 MG/ML IV SOLN
INTRAVENOUS | Status: DC | PRN
  Administered 2015-05-14: 40 mg via INTRAVENOUS

## 2015-05-14 MED ORDER — BUPIVACAINE HCL (PF) 0.5 % IJ SOLN
INTRAMUSCULAR | Status: DC | PRN
  Administered 2015-05-14: 17 mL

## 2015-05-14 MED ORDER — LACTATED RINGERS IV SOLN
INTRAVENOUS | Status: DC
  Administered 2015-05-14: 08:00:00 via INTRAVENOUS
  Administered 2015-05-14: 1000 mL via INTRAVENOUS
  Administered 2015-05-14: 09:00:00 via INTRAVENOUS

## 2015-05-14 MED ORDER — HYDROMORPHONE 1 MG/ML IV SOLN
INTRAVENOUS | Status: DC
  Administered 2015-05-14: 11:00:00 via INTRAVENOUS
  Administered 2015-05-14: 2.3 mg via INTRAVENOUS
  Administered 2015-05-14: 0.3 mg via INTRAVENOUS

## 2015-05-14 MED ORDER — EPHEDRINE SULFATE 50 MG/ML IJ SOLN
INTRAMUSCULAR | Status: AC
  Filled 2015-05-14: qty 1

## 2015-05-14 SURGICAL SUPPLY — 76 items
APL SKNCLS STERI-STRIP NONHPOA (GAUZE/BANDAGES/DRESSINGS) ×2
APPLIER CLIP 11 MED OPEN (CLIP)
APPLIER CLIP 13 LRG OPEN (CLIP)
APR CLP LRG 13 20 CLIP (CLIP)
APR CLP MED 11 20 MLT OPN (CLIP)
BAG HAMPER (MISCELLANEOUS) ×3 IMPLANT
BENZOIN TINCTURE PRP APPL 2/3 (GAUZE/BANDAGES/DRESSINGS) ×1 IMPLANT
CELLS DAT CNTRL 66122 CELL SVR (MISCELLANEOUS) ×2 IMPLANT
CLIP APPLIE 11 MED OPEN (CLIP) IMPLANT
CLIP APPLIE 13 LRG OPEN (CLIP) IMPLANT
CLOTH BEACON ORANGE TIMEOUT ST (SAFETY) ×3 IMPLANT
COVER LIGHT HANDLE STERIS (MISCELLANEOUS) ×6 IMPLANT
DRAPE WARM FLUID 44X44 (DRAPE) ×3 IMPLANT
DRESSING TELFA 8X3 (GAUZE/BANDAGES/DRESSINGS) ×3 IMPLANT
DRSG OPSITE POSTOP 4X10 (GAUZE/BANDAGES/DRESSINGS) ×1 IMPLANT
DRSG OPSITE POSTOP 4X8 (GAUZE/BANDAGES/DRESSINGS) IMPLANT
DURAPREP 26ML APPLICATOR (WOUND CARE) ×2 IMPLANT
ELECT REM PT RETURN 9FT ADLT (ELECTROSURGICAL) ×3
ELECTRODE REM PT RTRN 9FT ADLT (ELECTROSURGICAL) ×2 IMPLANT
EVACUATOR DRAINAGE 10X20 100CC (DRAIN) ×2 IMPLANT
EVACUATOR SILICONE 100CC (DRAIN)
FORMALIN 10 PREFIL 480ML (MISCELLANEOUS) ×3 IMPLANT
GAUZE PACKING 2X5 YD STRL (GAUZE/BANDAGES/DRESSINGS) ×3 IMPLANT
GAUZE SPONGE 4X4 12PLY STRL (GAUZE/BANDAGES/DRESSINGS) ×3 IMPLANT
GLOVE BIOGEL PI IND STRL 7.0 (GLOVE) ×2 IMPLANT
GLOVE BIOGEL PI IND STRL 9 (GLOVE) ×2 IMPLANT
GLOVE BIOGEL PI INDICATOR 7.0 (GLOVE) ×2
GLOVE BIOGEL PI INDICATOR 9 (GLOVE) ×1
GLOVE ECLIPSE 6.5 STRL STRAW (GLOVE) ×2 IMPLANT
GLOVE ECLIPSE 9.0 STRL (GLOVE) ×3 IMPLANT
GLOVE EXAM NITRILE LRG STRL (GLOVE) ×1 IMPLANT
GLOVE EXAM NITRILE MD LF STRL (GLOVE) ×2 IMPLANT
GOWN SPEC L3 XXLG W/TWL (GOWN DISPOSABLE) ×5 IMPLANT
GOWN STRL REUS W/TWL LRG LVL3 (GOWN DISPOSABLE) ×7 IMPLANT
INST SET MAJOR GENERAL (KITS) ×3 IMPLANT
KIT ROOM TURNOVER APOR (KITS) ×3 IMPLANT
MANIFOLD NEPTUNE II (INSTRUMENTS) ×3 IMPLANT
NDL HYPO 18GX1.5 BLUNT FILL (NEEDLE) ×2 IMPLANT
NDL HYPO 25X1 1.5 SAFETY (NEEDLE) ×2 IMPLANT
NEEDLE HYPO 18GX1.5 BLUNT FILL (NEEDLE) IMPLANT
NEEDLE HYPO 25X1 1.5 SAFETY (NEEDLE) ×3 IMPLANT
NS IRRIG 1000ML POUR BTL (IV SOLUTION) ×6 IMPLANT
PACK ABDOMINAL MAJOR (CUSTOM PROCEDURE TRAY) ×3 IMPLANT
PAD ABD 5X9 TENDERSORB (GAUZE/BANDAGES/DRESSINGS) ×3 IMPLANT
PAD ARMBOARD 7.5X6 YLW CONV (MISCELLANEOUS) ×3 IMPLANT
RETRACTOR WND ALEXIS 18 MED (MISCELLANEOUS) IMPLANT
RETRACTOR WND ALEXIS 25 LRG (MISCELLANEOUS) IMPLANT
RTRCTR WOUND ALEXIS 18CM MED (MISCELLANEOUS) ×3
RTRCTR WOUND ALEXIS 25CM LRG (MISCELLANEOUS)
SET BASIN LINEN APH (SET/KITS/TRAYS/PACK) ×3 IMPLANT
SOL PREP PROV IODINE SCRUB 4OZ (MISCELLANEOUS) ×2 IMPLANT
SPONGE DRAIN TRACH 4X4 STRL 2S (GAUZE/BANDAGES/DRESSINGS) IMPLANT
SPONGE LAP 18X18 X RAY DECT (DISPOSABLE) IMPLANT
STAPLER VISISTAT 35W (STAPLE) ×3 IMPLANT
STRIP CLOSURE SKIN 1/2X4 (GAUZE/BANDAGES/DRESSINGS) ×6 IMPLANT
SUT CHROMIC 0 CT 1 (SUTURE) ×24 IMPLANT
SUT CHROMIC 2 0 CT 1 (SUTURE) ×3 IMPLANT
SUT CHROMIC GUT BROWN 0 54 (SUTURE) IMPLANT
SUT CHROMIC GUT BROWN 0 54IN (SUTURE)
SUT ETHILON 3 0 FSL (SUTURE) IMPLANT
SUT MON AB 3-0 SH 27 (SUTURE) IMPLANT
SUT PDS AB CT VIOLET #0 27IN (SUTURE) IMPLANT
SUT PLAIN CT 1/2CIR 2-0 27IN (SUTURE) ×4 IMPLANT
SUT PROLENE 0 CT 1 30 (SUTURE) ×4 IMPLANT
SUT VIC AB 0 CT1 27 (SUTURE) ×3
SUT VIC AB 0 CT1 27XBRD ANTBC (SUTURE) ×2 IMPLANT
SUT VIC AB 0 CTX 36 (SUTURE) ×3
SUT VIC AB 0 CTX36XBRD ANTBCTR (SUTURE) ×2 IMPLANT
SUT VIC AB 2-0 CT1 27 (SUTURE)
SUT VIC AB 2-0 CT1 TAPERPNT 27 (SUTURE) IMPLANT
SUT VICRYL 3 0 (SUTURE) IMPLANT
SUT VICRYL 4 0 KS 27 (SUTURE) ×3 IMPLANT
SYR 20CC LL (SYRINGE) ×3 IMPLANT
TOWEL BLUE STERILE X RAY DET (MISCELLANEOUS) ×3 IMPLANT
TOWEL OR 17X26 4PK STRL BLUE (TOWEL DISPOSABLE) ×3 IMPLANT
TRAY FOLEY CATH SILVER 16FR (SET/KITS/TRAYS/PACK) ×3 IMPLANT

## 2015-05-14 NOTE — Interval H&P Note (Signed)
History and Physical Interval Note:  05/14/2015 7:25 AM  Kayla Cervantes  has presented today for surgery, with the diagnosis of CIN III,residual dysplasia  The various methods of treatment have been discussed with the patient and family. After consideration of risks, benefits and other options for treatment, the patient has consented to  Procedure(s): HYSTERECTOMY ABDOMINAL (N/A) BILATERAL SALPINGO OOPHORECTOMY (Bilateral) WIDE EXCISION OF OLD CICATRIX (N/A) as a surgical intervention .  The patient's history has been reviewed, patient examined, no change in status, stable for surgery.  I have reviewed the patient's chart and labs.  Questions were answered to the patient's satisfaction.     Tilda BurrowFERGUSON,Liv Rallis V

## 2015-05-14 NOTE — Op Note (Signed)
Please see the brief operative note for surgical details 

## 2015-05-14 NOTE — Anesthesia Postprocedure Evaluation (Deleted)
Anesthesia Post Note  Patient: Kayla Cervantes  Procedure(s) Performed: Procedure(s) (LRB): HYSTERECTOMY ABDOMINAL (N/A) BILATERAL SALPINGO OOPHORECTOMY (Bilateral) WIDE EXCISION OF OLD CICATRIX (N/A)  Patient location during evaluation: PACU Anesthesia Type: General Level of consciousness: awake and alert and oriented Pain management: pain level controlled Vital Signs Assessment: post-procedure vital signs reviewed and stable Respiratory status: spontaneous breathing Cardiovascular status: blood pressure returned to baseline Anesthetic complications: no    Last Vitals:  Filed Vitals:   05/14/15 1000 05/14/15 1015  BP: 118/54 109/72  Pulse: 73 77  Temp:    Resp: 18 15    Last Pain:  Filed Vitals:   05/14/15 1036  PainSc: 7                  Ellysa Parrack

## 2015-05-14 NOTE — Anesthesia Postprocedure Evaluation (Signed)
Anesthesia Post Note  Patient: Kayla RushCindy D Ptacek  Procedure(s) Performed: Procedure(s) (LRB): HYSTERECTOMY ABDOMINAL (N/A) BILATERAL SALPINGO OOPHORECTOMY (Bilateral) WIDE EXCISION OF OLD CICATRIX (N/A)  Patient location during evaluation: PACU Anesthesia Type: General Level of consciousness: awake and alert Pain management: pain level controlled Vital Signs Assessment: post-procedure vital signs reviewed and stable Respiratory status: spontaneous breathing Cardiovascular status: stable Anesthetic complications: no    Last Vitals:  Filed Vitals:   05/14/15 0730 05/14/15 0930  BP: 112/72   Pulse:    Temp:  36.6 C  Resp: 20     Last Pain:  Filed Vitals:   05/14/15 0937  PainSc: Asleep                 Minerva AreolaYATES,Kristoph Sattler

## 2015-05-14 NOTE — Brief Op Note (Signed)
05/14/2015  9:28 AM  PATIENT:  Kayla Cervantes  54 y.o. female  PRE-OPERATIVE DIAGNOSIS:  cervical intraepithelial neoplasia III, residual dysplasia status post conization  POST-OPERATIVE DIAGNOSIS:  cervical intraepithelial neoplasia III, residual dysplasia status post conization  PROCEDURE:  Procedure(s): HYSTERECTOMY ABDOMINAL (N/A) BILATERAL SALPINGO OOPHORECTOMY (Bilateral) WIDE EXCISION OF OLD CICATRIX (N/A)  SURGEON:  Surgeon(s) and Role:    * Tilda BurrowJohn Blakely Maranan V, MD - Primary  PHYSICIAN ASSISTANT:   ASSISTANTS: [place] RN   ANESTHESIA:   local and general  EBL:  Total I/O In: 2000 [I.V.:2000] Out: 125 [Urine:75; Blood:50]  BLOOD ADMINISTERED:none  DRAINS: Urinary Catheter (Foley)   LOCAL MEDICATIONS USED:  MARCAINE    and Amount: 18 ml  SPECIMEN:  Source of Specimen:  Uterus cervix tubes and ovaries  DISPOSITION OF SPECIMEN:  PATHOLOGY  COUNTS:  YES  TOURNIQUET:  * No tourniquets in log *  DICTATION: .Dragon Dictation  PLAN OF CARE: Admit to inpatient   PATIENT DISPOSITION:  PACU - hemodynamically stable.   Delay start of Pharmacological VTE agent (>24hrs) due to surgical blood loss or risk of bleeding: not applicable Indications: Recently status post conization or CIN-3 with residual endocervical disease, requesting hysterectomy after counseling. Bilateral salpingo-oophorectomy requested an abdominal hysterectomy taken over LAVH due to limited descent of uterus and prior cesarean. Scar revised.  Details of procedure: Patient was taken operating room prepped and draped for lower abdominal surgery with Foley catheter in place. Timeout was conducted Ancef was administered. The old cicatrix was excised with 3-1/2 cm of skin above the incision, and standard Pfannenstiel technique used for entry of the abdominal cavity. There were no adhesions to the anterior abdominal wall and the pelvis was completely normal in appearance. Ovaries were atrophic. Evidence of prior tubal  sterilization was present bowel could be packed away with laparotomy moistened tapes, and attention to the uterus. Kelly clamps were placed at each cornu, the round ligament was taken down ligated, and the infundibulopelvic ligaments isolated with specific identification of the ureters in the retroperitoneum and confirmation that they were well away from the point of ligation of the IP ligament uterine vessels skeletonized, and bladder flap sharply dissected off the anterior lower uterine segment and cervix. Curved Heaney clamp was used to cross-clamp uterine vessels which were transected and ligated, then upper cardinal ligament grasped with straight Heaney clamp knife dissection used then chromic 0 ligature performed at this point we were at the level of the uterosacral ligament insertion] cervix was stab incision was made in the anterior cervicovaginal fornix, and the cervix circumscribed and amputated off of the cuff Coker clamps were used to [facility] angle of the vaginal cuff. Aldridge stitch was placed at each lateral vaginal angle for support and then closure of the cuff with a series of interrupted 0 chromic sutures front to back adequate support was achieved. Pelvis was irrigated and inspected for hemostasis and confirmed as hemostatic. Laparotomy equipment was removed, anterior peritoneum closed with running 2-0 chromic, fascia closed with running 0 Vicryl, subcutaneous tissues approximated with interrupted 20 plain sutures 5, then subcuticular 4-0 Vicryl used to close the skin Steri-Strips were applied, with, dressing applied and patient was sent to recovery room in stable condition. Sponge and needle counts were correct throughout.

## 2015-05-14 NOTE — Transfer of Care (Signed)
Immediate Anesthesia Transfer of Care Note  Patient: Kayla Cervantes  Procedure(s) Performed: Procedure(s): HYSTERECTOMY ABDOMINAL (N/A) BILATERAL SALPINGO OOPHORECTOMY (Bilateral) WIDE EXCISION OF OLD CICATRIX (N/A)  Patient Location: PACU  Anesthesia Type:General  Level of Consciousness: awake and alert   Airway & Oxygen Therapy: Patient Spontanous Breathing and Patient connected to face mask oxygen  Post-op Assessment: Report given to RN  Post vital signs: Reviewed and stable  Last Vitals:  Filed Vitals:   05/14/15 0725 05/14/15 0730  BP: 119/70 112/72  Pulse:    Temp:    Resp: 15 20    Complications: No apparent anesthesia complications

## 2015-05-14 NOTE — Anesthesia Preprocedure Evaluation (Signed)
Anesthesia Evaluation  Patient identified by MRN, date of birth, ID band Patient awake    Reviewed: Allergy & Precautions, NPO status , Patient's Chart, lab work & pertinent test results  Airway Mallampati: I  TM Distance: >3 FB     Dental  (+) Teeth Intact, Implants,    Pulmonary Current Smoker,    breath sounds clear to auscultation       Cardiovascular + angina (infrequent CP, normal echo)  Rhythm:Regular Rate:Normal     Neuro/Psych    GI/Hepatic negative GI ROS,   Endo/Other  Hypothyroidism   Renal/GU      Musculoskeletal   Abdominal   Peds  Hematology   Anesthesia Other Findings   Reproductive/Obstetrics                             Anesthesia Physical Anesthesia Plan  ASA: II  Anesthesia Plan: General   Post-op Pain Management:    Induction: Intravenous  Airway Management Planned: Oral ETT  Additional Equipment:   Intra-op Plan:   Post-operative Plan: Extubation in OR  Informed Consent: I have reviewed the patients History and Physical, chart, labs and discussed the procedure including the risks, benefits and alternatives for the proposed anesthesia with the patient or authorized representative who has indicated his/her understanding and acceptance.     Plan Discussed with:   Anesthesia Plan Comments:         Anesthesia Quick Evaluation

## 2015-05-14 NOTE — Anesthesia Procedure Notes (Signed)
Procedure Name: Intubation Date/Time: 05/14/2015 7:42 AM Performed by: Glynn OctaveANIEL, Kayla Cervantes E Pre-anesthesia Checklist: Patient identified, Patient being monitored, Timeout performed, Emergency Drugs available and Suction available Patient Re-evaluated:Patient Re-evaluated prior to inductionOxygen Delivery Method: Circle System Utilized Preoxygenation: Pre-oxygenation with 100% oxygen Intubation Type: IV induction Ventilation: Mask ventilation without difficulty Laryngoscope Size: Mac and 3 Grade View: Grade I Tube type: Oral Tube size: 7.0 mm Number of attempts: 1 Airway Equipment and Method: Stylet Placement Confirmation: ETT inserted through vocal cords under direct vision,  positive ETCO2 and breath sounds checked- equal and bilateral Secured at: 21 cm Tube secured with: Tape Dental Injury: Teeth and Oropharynx as per pre-operative assessment

## 2015-05-14 NOTE — Progress Notes (Signed)
Patient requests the PCA to be stopped due to the constant beeping when she's asleep, explained that the MD would be notified to order prn IV pain medication for breakthrough pain, verbalized understanding, Dr. Emelda FearFerguson notified, will round and enter orders.

## 2015-05-14 NOTE — Progress Notes (Signed)
Day of Surgery Procedure(s) (LRB): HYSTERECTOMY ABDOMINAL (N/A) BILATERAL SALPINGO OOPHORECTOMY (Bilateral) WIDE EXCISION OF OLD CICATRIX (N/A)  Subjective: Patient reports incisional pain.  That is moderate or mild. She requests discontinuation of the PCA due to its continued distraction and beeping  Objective: I have reviewed patient's vital signs, intake and output and medications. Pain rated 4-5 out of 10  General: alert, cooperative, appears stated age and mild distress  Assessment: s/p Procedure(s): HYSTERECTOMY ABDOMINAL (N/A) BILATERAL SALPINGO OOPHORECTOMY (Bilateral) WIDE EXCISION OF OLD CICATRIX (N/A): stable and progressing well  Plan: DC PCA DC Foley     Yury Schaus V 05/14/2015, 6:46 PM

## 2015-05-15 ENCOUNTER — Encounter (HOSPITAL_COMMUNITY): Payer: Self-pay | Admitting: Obstetrics and Gynecology

## 2015-05-15 DIAGNOSIS — R87613 High grade squamous intraepithelial lesion on cytologic smear of cervix (HGSIL): Secondary | ICD-10-CM | POA: Diagnosis not present

## 2015-05-15 LAB — BASIC METABOLIC PANEL
Anion gap: 5 (ref 5–15)
BUN: 10 mg/dL (ref 6–20)
CHLORIDE: 104 mmol/L (ref 101–111)
CO2: 27 mmol/L (ref 22–32)
CREATININE: 0.6 mg/dL (ref 0.44–1.00)
Calcium: 8.5 mg/dL — ABNORMAL LOW (ref 8.9–10.3)
GFR calc non Af Amer: >60 mL/min (ref >60)
Glucose, Bld: 93 mg/dL (ref 65–99)
POTASSIUM: 4 mmol/L (ref 3.5–5.1)
Sodium: 136 mmol/L (ref 135–145)

## 2015-05-15 LAB — CBC
HCT: 37.4 % (ref 36.0–46.0)
Hemoglobin: 12.5 g/dL (ref 12.0–15.0)
MCH: 31.2 pg (ref 26.0–34.0)
MCHC: 33.4 g/dL (ref 30.0–36.0)
MCV: 93.3 fL (ref 78.0–100.0)
Platelets: 254 10*3/uL (ref 150–400)
RBC: 4.01 MIL/uL (ref 3.87–5.11)
RDW: 13 % (ref 11.5–15.5)
WBC: 12.7 10*3/uL — ABNORMAL HIGH (ref 4.0–10.5)

## 2015-05-15 MED ORDER — OXYCODONE-ACETAMINOPHEN 5-325 MG PO TABS: 1.0000 | ORAL_TABLET | ORAL | Status: DC | PRN

## 2015-05-15 MED ORDER — IBUPROFEN 600 MG PO TABS
600.0000 mg | ORAL_TABLET | Freq: Four times a day (QID) | ORAL | Status: DC | PRN
Start: 2015-05-15 — End: 2019-03-15

## 2015-05-15 NOTE — Care Management Note (Signed)
Case Management Note  Patient Details  Name: Harriett RushCindy D Kunesh MRN: [id number] Date of Birth: Apr 03, 1961  Subjective/Objective:                  Pt admitted from home s/p abd hyst. Pt lives with family and will return home at discharge. Pt is independent with ADL's.  Action/Plan: Pt discharged home today. No CM needs noted.  Expected Discharge Date:                  Expected Discharge Plan:  Home/Self Care  In-House Referral:  NA  Discharge planning Services  CM Consult  Post Acute Care Choice:  NA Choice offered to:  NA  DME Arranged:    DME Agency:     HH Arranged:    HH Agency:     Status of Service:  Completed, signed off  Medicare Important Message Given:    Date Medicare IM Given:    Medicare IM give by:    Date Additional Medicare IM Given:    Additional Medicare Important Message give by:     If discussed at Long Length of Stay Meetings, dates discussed:    Additional Comments:  Cheryl FlashBlackwell, Dawana Asper Crowder, RN 05/15/2015, 11:21 AM

## 2015-05-15 NOTE — Progress Notes (Signed)
Clean honeycomb dressing appied to midline abdominal incision prior to discharge per Dr. Emelda FearFerguson.

## 2015-05-15 NOTE — Discharge Instructions (Signed)
Buy Miralax powder, and use it daily until bowel movements regular  Abdominal Hysterectomy, Care After Refer to this sheet in the next few weeks. These instructions provide you with information on caring for yourself after your procedure. Your health care provider may also give you more specific instructions. Your treatment has been planned according to current medical practices, but problems sometimes occur. Call your health care provider if you have any problems or questions after your procedure.  WHAT TO EXPECT AFTER THE PROCEDURE After your procedure, it is typical to have the following:  Pain.  Feeling tired.  Poor appetite.  Less interest in sex. It takes 4-6 weeks to recover from this surgery.  HOME CARE INSTRUCTIONS   Take pain medicines only as directed by your health care provider. Do not take over-the-counter pain medicines without checking with your health care provider first.  Change your bandage as directed by your health care provider.  Return to your health care provider to have your sutures taken out.  Take showers instead of baths for 2-3 weeks. Ask your health care provider when it is safe to start showering.  Do not douche, use tampons, or have sexual intercourse for at least 6 weeks or until your health care provider says you can.   Follow your health care provider's advice about exercise, lifting, driving, and general activities.  Get plenty of rest and sleep.   Do not lift anything heavier than a gallon of milk (about 10 lb [4.5 kg]) for the first month after surgery.  You can resume your normal diet if your health care provider says it is okay.   Do not drink alcohol until your health care provider says you can.   If you are constipated, ask your health care provider if you can take a mild laxative.  Eating foods high in fiber may also help with constipation. Eat plenty of raw fruits and vegetables, whole grains, and beans.  Drink enough fluids  to keep your urine clear or pale yellow.   Try to have someone at home with you for the first 1-2 weeks to help around the house.  Keep all follow-up appointments. SEEK MEDICAL CARE IF:   You have chills or fever.  You have swelling, redness, or pain in the area of your incision that is getting worse.   You have pus coming from the incision.   You notice a bad smell coming from the incision or bandage.   Your incision breaks open.   You feel dizzy or light-headed.   You have pain or bleeding when you urinate.   You have persistent diarrhea.   You have persistent nausea and vomiting.   You have abnormal vaginal discharge.   You have a rash.   You have any type of abnormal reaction or develop an allergy to your medicine.   Your pain medicine is not helping.  SEEK IMMEDIATE MEDICAL CARE IF:   You have a fever and your symptoms suddenly get worse.  You have severe abdominal pain.  You have chest pain.  You have shortness of breath.  You faint.  You have pain, swelling, or redness of your leg.  You have heavy vaginal bleeding with blood clots. MAKE SURE YOU:  Understand these instructions.  Will watch your condition.  Will get help right away if you are not doing well or get worse.   This information is not intended to replace advice given to you by your health care provider. Make sure you discuss  any questions you have with your health care provider.   Document Released: 12/05/2004 Document Revised: 06/08/2014 Document Reviewed: 03/10/2013 Elsevier Interactive Patient Education [employer]

## 2015-05-15 NOTE — Progress Notes (Signed)
IV access removed per orders.  Discharge instructions reviewed with patient, questions answered, copy given with prescriptions.  Discharged via wheelchair to home in care of husband.  Stable at discharge.

## 2015-05-21 ENCOUNTER — Encounter: Payer: Self-pay | Admitting: Advanced Practice Midwife

## 2015-05-21 ENCOUNTER — Ambulatory Visit (INDEPENDENT_AMBULATORY_CARE_PROVIDER_SITE_OTHER): Payer: BLUE CROSS/BLUE SHIELD | Admitting: Advanced Practice Midwife

## 2015-05-21 VITALS — BP 118/82 | HR 70 | Ht 64.0 in | Wt 150.0 lb

## 2015-05-21 DIAGNOSIS — Z9889 Other specified postprocedural states: Secondary | ICD-10-CM

## 2015-05-21 NOTE — Progress Notes (Signed)
[facility] Visit  Patient name: Kayla Cervantes MRN [id number]  Date of birth: 10-06-1960  CC & HPI:  Kayla Cervantes is a 54 y.o. Caucasian female presenting today for 1 week post op TAH/BSO for HGSIL (s/p LEEP).  Pt feels well, is only taking pain meds 1x/day now, before bed.  Denies vaginal drainage, incisional drainage.  + BM, no problems voiding   Pertinent History Reviewed:  Medical & Surgical Hx:   Past Medical History  Diagnosis Date  . Chest pain 09/22/2011    2D Echo EF>55%  . Hypothyroidism   . Heart valve problem   . Graves disease   . Vaginal Pap smear, abnormal    Past Surgical History  Procedure Laterality Date  . Cardiac catheterization  12/31/2000    for chest pain EF 60%  . Cesarean section    . Left knee arthroscopy    . Breast surgery      X 5-benign lumpectomies  . Back surgery    . Colonoscopy    . Esophagogastroduodenoscopy    . Colonoscopy N/A 03/18/2015    Procedure: COLONOSCOPY;  Surgeon: Corbin Adeobert M Rourk, MD;  Location: [facility];  Service: Endoscopy;  Laterality: N/A;  10:30 AM  . Abdominal hysterectomy N/A 05/14/2015    Procedure: HYSTERECTOMY ABDOMINAL;  Surgeon: Tilda BurrowJohn Ferguson V, MD;  Location: [facility];  Service: Gynecology;  Laterality: N/A;  . Salpingoophorectomy Bilateral 05/14/2015    Procedure: BILATERAL SALPINGO OOPHORECTOMY;  Surgeon: Tilda BurrowJohn Ferguson V, MD;  Location: [facility];  Service: Gynecology;  Laterality: Bilateral;  . Scar revision N/A 05/14/2015    Procedure: WIDE EXCISION OF OLD CICATRIX;  Surgeon: Tilda BurrowJohn Ferguson V, MD;  Location: [facility];  Service: Gynecology;  Laterality: N/A;   Family History  Problem Relation Age of Onset  . Heart disease Mother 1080    [facility]  . Heart attack Father 5038    deceased  . Stroke Maternal Grandmother     deceased, heart failure  . Diabetes Paternal Grandmother 2879    deceased  . Cancer Paternal Grandfather 180    deceased  . Colon cancer Paternal Grandfather   .  Thyroid disease Brother [id number]    deceased bleeding at surgery    Current outpatient prescriptions:  .  acetaminophen (TYLENOL) 500 MG tablet, Take 1,000 mg by mouth every 6 (six) hours as needed for mild pain., Disp: , Rfl:  .  ibuprofen (ADVIL,MOTRIN) 600 MG tablet, Take 1 tablet (600 mg total) by mouth every 6 (six) hours as needed (mild pain)., Disp: 30 tablet, Rfl: 0 .  levothyroxine (SYNTHROID, LEVOTHROID) 175 MCG tablet, Take 1 tablet by mouth daily., Disp: , Rfl:  .  oxyCODONE-acetaminophen (PERCOCET/ROXICET) 5-325 MG tablet, Take 1-2 tablets by mouth every 4 (four) hours as needed for severe pain (moderate to severe pain (when tolerating fluids))., Disp: 30 tablet, Rfl: 0 .  polyethylene glycol (MIRALAX / GLYCOLAX) packet, Take 17 g by mouth daily., Disp: , Rfl:  Social History: Reviewed -  reports that she has been smoking Cigarettes.  She has a 35 pack-year smoking history. She has never used smokeless tobacco.  Review of Systems:   Constitutional: Negative for fever and chills Eyes: Negative for visual disturbances Respiratory: Negative for shortness of breath, dyspnea Cardiovascular: Negative for chest pain or palpitations  Gastrointestinal: Negative for vomiting, diarrhea and constipation; no abdominal pain Genitourinary: Negative for dysuria and urgency, vaginal irritation or itching Musculoskeletal: Negative for back  pain, joint pain, myalgias  Neurological: Negative for dizziness and headaches    Objective Findings:    Physical Examination: General appearance - well appearing, and in no distress Mental status - alert, oriented to person, place, and time Chest:  Normal respiratory effort Heart - normal rate and regular rhythm Abdomen:  Soft, nontender.  DGS removed, steri strips removed. 3-17mm area on right side of incision treated with silver nitrate and steri strips reapplied, mainly for cosmetic consequences.  Pelvic: deferred Musculoskeletal:  Normal range of motion  without pain Extremities:  No edema    No results found for this or any previous visit (from the past 24 hour(s)).    Assessment & Plan:  A:   1 week SP TAH/BSO, doing great P:  Discuss need for future pap smears (how often, etc)   Return in about 4 weeks (around 06/18/2015), or wiht ferg for post op.  CRESENZO-DISHMAN,Aislee Landgren CNM 05/21/2015 11:42 AM

## 2015-06-19 ENCOUNTER — Encounter: Payer: Self-pay | Admitting: Obstetrics and Gynecology

## 2015-06-19 ENCOUNTER — Ambulatory Visit (INDEPENDENT_AMBULATORY_CARE_PROVIDER_SITE_OTHER): Payer: BLUE CROSS/BLUE SHIELD | Admitting: Obstetrics and Gynecology

## 2015-06-19 VITALS — BP 110/60 | Ht 64.0 in | Wt 143.0 lb

## 2015-06-19 DIAGNOSIS — Z90721 Acquired absence of ovaries, unilateral: Secondary | ICD-10-CM

## 2015-06-19 DIAGNOSIS — Z9889 Other specified postprocedural states: Secondary | ICD-10-CM

## 2015-06-19 DIAGNOSIS — Z90710 Acquired absence of both cervix and uterus: Secondary | ICD-10-CM

## 2015-06-19 DIAGNOSIS — N871 Moderate cervical dysplasia: Secondary | ICD-10-CM

## 2015-06-19 MED ORDER — ESTRADIOL 1 MG PO TABS: 1.0000 mg | ORAL_TABLET | Freq: Every day | ORAL | Status: DC

## 2015-06-19 MED ORDER — POTASSIUM CHLORIDE ER 10 MEQ PO TBCR: 10.0000 meq | EXTENDED_RELEASE_TABLET | Freq: Every day | ORAL | Status: DC

## 2015-06-19 MED ORDER — HYDROCHLOROTHIAZIDE 25 MG PO TABS: 25.0000 mg | ORAL_TABLET | Freq: Every day | ORAL | Status: DC

## 2015-06-19 NOTE — Progress Notes (Signed)
Patient ID: Kayla Cervantes, female   DOB: [birth year], 55 y.o.   MRN: [id number] Pt here today for post op visit. Pt is 6 weeks out from surgery. Pt states that she has pain in her lower left side/back. Pt states that she has a lot of pressure when she voids.

## 2015-06-19 NOTE — Progress Notes (Signed)
Patient ID: Kayla Cervantes, female   DOB: 03-Aug-1960, 55 y.o.   MRN: [id number]    Subjective:  Kayla Cervantes is a 55 y.o. female now 6 weeks status post total abdominal hysterectomy. And bso   pt had left side ache postop that has gradually improved. Review of Systems Negative except LLQ ache   Diet:   reg   Bowel movements : normal.  Pain is controlled with current analgesics. Medications being used: ibuprofen (OTC) and narcotic analgesics including percocet.  Objective:  BP 110/60 mmHg  Ht  (1.626 m)  Wt 143 lb (64.864 kg)  BMI 24.53 kg/m2 General:Well developed, well nourished.  No acute distress. Abdomen: Bowel sounds normal, soft, non-tender. Pelvic Exam:    External Genitalia:  Normal.    Vagina: Normal    Cervix: Normalabsent     Uterus: Normalabsent    Adnexa/Bimanual: cuff thicker, on left, tender in to llq no mass.  Incision(s):   Healing slowly, no drainage, no erythema, no hernia, no swelling, no dehiscence, slight edema above incison     Assessment:  Post-Op 6 weeks s/p total abdominal hysterectomy and bilateral oophorectomy   and salpingectomy  Doing slow recovery postoperatively.   Plan:  1.Wound care discussed   2. . current medications.add hctz,25 x 30 d, kdur 10 mEq daily x30 d, and Estradiol 1. Mg daily 3. Activity restrictions: none 4. return to work: delay til 8 wk. 5. Follow up in 2 weeks.

## 2015-07-03 ENCOUNTER — Encounter: Payer: BLUE CROSS/BLUE SHIELD | Admitting: Obstetrics and Gynecology

## 2015-07-05 ENCOUNTER — Ambulatory Visit (INDEPENDENT_AMBULATORY_CARE_PROVIDER_SITE_OTHER): Payer: BLUE CROSS/BLUE SHIELD | Admitting: Obstetrics and Gynecology

## 2015-07-05 ENCOUNTER — Encounter: Payer: Self-pay | Admitting: Obstetrics and Gynecology

## 2015-07-05 VITALS — BP 112/80 | Ht 64.0 in | Wt 148.0 lb

## 2015-07-05 DIAGNOSIS — Z9889 Other specified postprocedural states: Secondary | ICD-10-CM

## 2015-07-05 DIAGNOSIS — Z90721 Acquired absence of ovaries, unilateral: Secondary | ICD-10-CM

## 2015-07-05 DIAGNOSIS — Z90710 Acquired absence of both cervix and uterus: Secondary | ICD-10-CM

## 2015-07-05 NOTE — Progress Notes (Signed)
Patient ID: Kayla Cervantes, female   DOB: Jan 04, 1961, 55 y.o.   MRN: [id number]  Subjective:  Kayla Cervantes is a 55 y.o. female now 8 weeks status post abdominal hysterectomy with BSO, wide excision of old cicatrix.   Review of Systems Negative   Diet:   normal   Bowel movements : normal.  Pain is controlled without any medications.  Objective:  BP 112/80 mmHg   Ht  (1.626 m)   Wt 148 lb (67.132 kg)   BMI 25.39 kg/m2 General:Well developed, well nourished.  No acute distress. Abdomen: Bowel sounds normal, soft, non-tender. Pelvic Exam:    External Genitalia:  Normal.    Vagina: Normal; good tissue support at top of the vagina     Cervix: Absent, removed surgically.     Uterus: Absent, removed surgically     Adnexa/Bimanual: Absent, removed surgically  Incision(s):   Healing well, no drainage, no erythema, no hernia, no swelling, no dehiscence, slight edema to the left side of the midline     Assessment:  Post-Op 8 weeks s/p abdominal hysterectomy with BSO, wide excision of old cicatrix   Excellent post operative state   Doing well postoperatively.   Plan:  1.Wound care discussed   2. . current medications. D/c the hydrodiuril, continue estrace   3. Activity restrictions: none 4. return to work: 4 days. 5. Follow up in prn .Marland Kitchen    By signing my name below, I, Doreatha Martin, attest that this documentation has been prepared under the direction and in the presence of [name], MD. Electronically Signed: Doreatha Martin, ED Scribe. 07/05/2015. 12:49 PM.  I personally performed the services described in this documentation, which was SCRIBED in my presence. The recorded information has been reviewed and considered accurate. It has been edited as necessary during review. [name], MD

## 2015-07-08 ENCOUNTER — Encounter: Payer: Self-pay | Admitting: Obstetrics and Gynecology

## 2016-04-08 DIAGNOSIS — F17210 Nicotine dependence, cigarettes, uncomplicated: Secondary | ICD-10-CM | POA: Diagnosis not present

## 2016-04-08 DIAGNOSIS — E039 Hypothyroidism, unspecified: Secondary | ICD-10-CM | POA: Diagnosis not present

## 2016-04-08 DIAGNOSIS — Z79899 Other long term (current) drug therapy: Secondary | ICD-10-CM | POA: Diagnosis not present

## 2016-04-08 DIAGNOSIS — H938X1 Other specified disorders of right ear: Secondary | ICD-10-CM | POA: Diagnosis not present

## 2016-04-08 DIAGNOSIS — H6501 Acute serous otitis media, right ear: Secondary | ICD-10-CM | POA: Diagnosis not present

## 2016-04-08 DIAGNOSIS — Z720 Tobacco use: Secondary | ICD-10-CM | POA: Diagnosis not present

## 2016-06-04 ENCOUNTER — Emergency Department (HOSPITAL_COMMUNITY)
Admission: EM | Admit: 2016-06-04 | Discharge: 2016-06-04 | Disposition: A | Discharge status: Home / Self Care | Payer: BLUE CROSS/BLUE SHIELD | Attending: Emergency Medicine | Admitting: Emergency Medicine

## 2016-06-04 ENCOUNTER — Emergency Department (HOSPITAL_COMMUNITY): Payer: BLUE CROSS/BLUE SHIELD

## 2016-06-04 ENCOUNTER — Encounter (HOSPITAL_COMMUNITY): Payer: Self-pay | Admitting: Emergency Medicine

## 2016-06-04 DIAGNOSIS — F17210 Nicotine dependence, cigarettes, uncomplicated: Secondary | ICD-10-CM | POA: Insufficient documentation

## 2016-06-04 DIAGNOSIS — J3489 Other specified disorders of nose and nasal sinuses: Secondary | ICD-10-CM | POA: Insufficient documentation

## 2016-06-04 DIAGNOSIS — Z79899 Other long term (current) drug therapy: Secondary | ICD-10-CM | POA: Diagnosis not present

## 2016-06-04 DIAGNOSIS — R067 Sneezing: Secondary | ICD-10-CM | POA: Insufficient documentation

## 2016-06-04 DIAGNOSIS — J029 Acute pharyngitis, unspecified: Secondary | ICD-10-CM | POA: Insufficient documentation

## 2016-06-04 DIAGNOSIS — R05 Cough: Secondary | ICD-10-CM | POA: Diagnosis not present

## 2016-06-04 DIAGNOSIS — R0602 Shortness of breath: Secondary | ICD-10-CM | POA: Diagnosis not present

## 2016-06-04 DIAGNOSIS — E039 Hypothyroidism, unspecified: Secondary | ICD-10-CM | POA: Insufficient documentation

## 2016-06-04 DIAGNOSIS — R059 Cough, unspecified: Secondary | ICD-10-CM

## 2016-06-04 MED ORDER — AZITHROMYCIN 250 MG PO TABS: 250.0000 mg | ORAL_TABLET | Freq: Every day | ORAL | 0 refills | Status: DC

## 2016-06-04 MED ORDER — BENZONATATE 100 MG PO CAPS: 200.0000 mg | ORAL_CAPSULE | Freq: Two times a day (BID) | ORAL | 0 refills | Status: DC | PRN

## 2016-06-04 NOTE — ED Provider Notes (Signed)
[facility]-EM[facility]PT Provider Note   CSN: [id number] Arrival date & time: 06/04/16  1129     History   Chief Complaint Chief Complaint  Patient presents with  . Cough    HPI Kayla Cervantes is a 56 y.o. female.  Patient presents to the emergency department with chief complaint of sinus congestion and cough. She states the symptoms started 3 weeks ago. She reports persistent symptoms until now. She complains of associated sore throat, right nose, and dry nonproductive cough. She has tried taking OTC medications with no relief. She reports having a fever about 3 weeks ago, but no recent fevers. She denies any other associated symptoms. There are no modifying factors. She denies any history of asthma, or COPD, but she is a daily smoker.   The history is provided by the patient. No language interpreter was used.    Past Medical History:  Diagnosis Date  . Chest pain 09/22/2011   2D Echo EF>55%  . Graves disease   . Heart valve problem   . Hypothyroidism   . Vaginal Pap smear, abnormal     Patient Active Problem List   Diagnosis Date Noted  . S/P abdominal hysterectomy 06/19/2015  . Status post hysterectomy with oophorectomy 05/14/2015  . Severe dysplasia of cervix (CIN III) 05/07/2015  . ? ofCIN II (cervical intraepithelial neoplasia II) on ECC 04/08/2015  . HSIL (high grade squamous intraepithelial lesion) on Pap smear of cervix 03/30/2015  . Special screening for malignant neoplasms, colon   . Other chest pain   . Chest pain 01/14/2015  . Hypothyroidism 01/14/2015  . Tobacco use disorder 01/14/2015    Past Surgical History:  Procedure Laterality Date  . ABDOMINAL HYSTERECTOMY N/A 05/14/2015   Procedure: HYSTERECTOMY ABDOMINAL;  Surgeon: [name], MD;  Location: [facility] ORS;  Service: Gynecology;  Laterality: N/A;  . BACK SURGERY    . BREAST SURGERY     X 5-benign lumpectomies  . CARDIAC CATHETERIZATION  12/31/2000   for chest pain EF 60%  . CESAREAN SECTION    .  COLONOSCOPY    . COLONOSCOPY N/A 03/18/2015   Procedure: COLONOSCOPY;  Surgeon: Corbin Ade, MD;  Location: [facility];  Service: Endoscopy;  Laterality: N/A;  10:30 AM  . ESOPHAGOGASTRODUODENOSCOPY    . Left knee arthroscopy    . SALPINGOOPHORECTOMY Bilateral 05/14/2015   Procedure: BILATERAL SALPINGO OOPHORECTOMY;  Surgeon: [name], MD;  Location: [facility];  Service: Gynecology;  Laterality: Bilateral;  . SCAR REVISION N/A 05/14/2015   Procedure: WIDE EXCISION OF OLD CICATRIX;  Surgeon: [name], MD;  Location: [facility];  Service: Gynecology;  Laterality: N/A;    OB History    Gravida Para Term Preterm AB Living   4 4 2 2   4    SAB TAB Ectopic Multiple Live Births                   Home Medications    Prior to Admission medications   Medication Sig Start Date End Date Taking? Authorizing Provider  acetaminophen (TYLENOL) 500 MG tablet Take 1,000 mg by mouth every 6 (six) hours as needed for mild pain.    Historical Provider, MD  azithromycin (ZITHROMAX) 250 MG tablet Take 1 tablet (250 mg total) by mouth daily. Take first 2 tablets together, then 1 every day until finished. 06/04/16   Roxy Horseman, PA-C  benzonatate (TESSALON) 100 MG capsule Take 2 capsules (200 mg total) by mouth 2 (two) times  daily as needed for cough. 06/04/16   Roxy Horseman, PA-C  estradiol (ESTRACE) 1 MG tablet Take 1 tablet (1 mg total) by mouth daily. 06/19/15 06/18/16  [name], MD  hydrochlorothiazide (HYDRODIURIL) 25 MG tablet Take 1 tablet (25 mg total) by mouth daily. 06/19/15   [name], MD  ibuprofen (ADVIL,MOTRIN) 600 MG tablet Take 1 tablet (600 mg total) by mouth every 6 (six) hours as needed (mild pain). Patient not taking: Reported on 07/05/2015 05/15/15   [name], MD  levothyroxine (SYNTHROID, LEVOTHROID) 175 MCG tablet Take 1 tablet by mouth daily. 04/07/15   Historical Provider, MD  polyethylene glycol (MIRALAX / GLYCOLAX) packet Take 17 g by mouth daily.     Historical Provider, MD  potassium chloride (K-DUR) 10 MEQ tablet Take 1 tablet (10 mEq total) by mouth daily. 06/19/15   [name], MD    Family History Family History  Problem Relation Age of Onset  . Heart disease Mother 20    lung disease ( at [facility] center)  . Heart attack Father 75    deceased  . Stroke Maternal Grandmother     deceased, heart failure  . Diabetes Paternal Grandmother 51    deceased  . Cancer Paternal Grandfather 23    deceased  . Colon cancer Paternal Grandfather   . Thyroid disease Brother 48    deceased bleeding at surgery    Social History Social History  Substance Use Topics  . Smoking status: Current Every Day Smoker    Packs/day: 1.00    Years: 35.00    Types: Cigarettes  . Smokeless tobacco: Never Used  . Alcohol use No     Allergies   Patient has no known allergies.   Review of Systems Review of Systems  Constitutional: Positive for chills. Negative for fever.  HENT: Positive for postnasal drip, rhinorrhea, sinus pressure, sneezing and sore throat.   Respiratory: Positive for cough. Negative for shortness of breath.   Cardiovascular: Negative for chest pain.  Gastrointestinal: Negative for abdominal pain, constipation, diarrhea, nausea and vomiting.  Genitourinary: Negative for dysuria.  All other systems reviewed and are negative.    Physical Exam Updated Vital Signs BP 131/78 (BP Location: Left Arm)   Pulse 80   Temp 97.6 F (36.4 C) (Oral)   Resp 20   Ht 5\' 4"  (1.626 m)   Wt 68 kg   SpO2 99%   BMI 25.75 kg/m   Physical Exam Physical Exam  Constitutional: Pt  is oriented to person, place, and time. Appears well-developed and well-nourished. No distress.  HENT:  Head: Normocephalic and atraumatic.  Right Ear: Tympanic membrane, external ear and ear canal normal.  Left Ear: Tympanic membrane, external ear and ear canal normal.  Nose: Mucosal edema and moderate rhinorrhea present. No epistaxis. Right sinus exhibits  no maxillary sinus tenderness and no frontal sinus tenderness. Left sinus exhibits no maxillary sinus tenderness and no frontal sinus tenderness.  Mouth/Throat: Uvula is midline and mucous membranes are normal. Mucous membranes are not pale and not cyanotic. No oropharyngeal exudate, posterior oropharyngeal edema, posterior oropharyngeal erythema or tonsillar abscesses.  Eyes: Conjunctivae are normal. Pupils are equal, round, and reactive to light.  Neck: Normal range of motion and full passive range of motion without pain.  Cardiovascular: Normal rate and intact distal pulses.   Pulmonary/Chest: Effort normal and breath sounds normal. No stridor.  Clear and equal breath sounds without focal wheezes, rhonchi, rales  Abdominal: Soft. Bowel sounds  are normal. There is no tenderness.  Musculoskeletal: Normal range of motion.  Lymphadenopathy:    Pthas no cervical adenopathy.  Neurological: Pt is alert and oriented to person, place, and time.  Skin: Skin is warm and dry. No rash noted. Pt is not diaphoretic.  Psychiatric: Normal mood and affect.  Nursing note and vitals reviewed.    ED Treatments / Results  Labs (all labs ordered are listed, but only abnormal results are displayed) Labs Reviewed - No data to display  EKG  EKG Interpretation None       Radiology Dg Chest 2 View  Result Date: 06/04/2016 CLINICAL DATA:  Nonproductive cough, shortness of breath EXAM: CHEST  2 VIEW COMPARISON:  01/13/2015 FINDINGS: The lungs are hyperinflated likely secondary to COPD. There is a linear band of airspace disease in the left lung base likely reflecting discoid atelectasis. There is no focal parenchymal opacity. There is no pleural effusion or pneumothorax. The heart and mediastinal contours are unremarkable. The osseous structures are unremarkable. IMPRESSION: No active cardiopulmonary disease. Electronically Signed   By: Elige KoHetal  Patel   On: 06/04/2016 11:55    Procedures Procedures (including  critical care time)  Medications Ordered in ED Medications - No data to display   Initial Impression / Assessment and Plan / ED Course  I have reviewed the triage vital signs and the nursing notes.  Pertinent labs & imaging results that were available during my care of the patient were reviewed by me and considered in my medical decision making (see chart for details).  Clinical Course     Pt CXR negative for acute infiltrate. Patients symptoms are consistent with URI, likely viral etiology. Discussed that antibiotics are not indicated for viral infections. Pt will be discharged with symptomatic treatment.  Verbalizes understanding and is agreeable with plan. Pt is hemodynamically stable & in NAD prior to dc.   Final Clinical Impressions(s) / ED Diagnoses   Final diagnoses:  Cough    New Prescriptions New Prescriptions   AZITHROMYCIN (ZITHROMAX) 250 MG TABLET    Take 1 tablet (250 mg total) by mouth daily. Take first 2 tablets together, then 1 every day until finished.   BENZONATATE (TESSALON) 100 MG CAPSULE    Take 2 capsules (200 mg total) by mouth 2 (two) times daily as needed for cough.     Roxy Horsemanobert Jamarie Joplin, PA-C 06/04/16 1222    Jacalyn LefevreJulie Haviland, MD 06/04/16 1414

## 2016-06-04 NOTE — ED Notes (Signed)
Pt made aware to return if symptoms worsen or if any life threatening symptoms occur.   

## 2016-06-04 NOTE — ED Triage Notes (Signed)
Onset 3 weeks ago, eye and nose running, sore throat, drainage, now coughing with chest congestion.

## 2016-09-04 ENCOUNTER — Other Ambulatory Visit: Payer: Self-pay | Admitting: Obstetrics and Gynecology

## 2017-11-01 ENCOUNTER — Other Ambulatory Visit: Payer: Self-pay | Admitting: Obstetrics and Gynecology

## 2017-11-18 DIAGNOSIS — M1991 Primary osteoarthritis, unspecified site: Secondary | ICD-10-CM | POA: Diagnosis not present

## 2017-11-18 DIAGNOSIS — R3129 Other microscopic hematuria: Secondary | ICD-10-CM | POA: Diagnosis not present

## 2017-11-18 DIAGNOSIS — E063 Autoimmune thyroiditis: Secondary | ICD-10-CM | POA: Diagnosis not present

## 2017-11-18 DIAGNOSIS — R300 Dysuria: Secondary | ICD-10-CM | POA: Diagnosis not present

## 2017-11-18 DIAGNOSIS — Z1389 Encounter for screening for other disorder: Secondary | ICD-10-CM | POA: Diagnosis not present

## 2017-11-18 DIAGNOSIS — E039 Hypothyroidism, unspecified: Secondary | ICD-10-CM | POA: Diagnosis not present

## 2017-11-18 DIAGNOSIS — R252 Cramp and spasm: Secondary | ICD-10-CM | POA: Diagnosis not present

## 2017-11-18 DIAGNOSIS — R3915 Urgency of urination: Secondary | ICD-10-CM | POA: Diagnosis not present

## 2017-11-18 DIAGNOSIS — Z79899 Other long term (current) drug therapy: Secondary | ICD-10-CM | POA: Diagnosis not present

## 2017-11-18 DIAGNOSIS — Z0001 Encounter for general adult medical examination with abnormal findings: Secondary | ICD-10-CM | POA: Diagnosis not present

## 2017-11-18 DIAGNOSIS — Z6822 Body mass index (BMI) 22.0-22.9, adult: Secondary | ICD-10-CM | POA: Diagnosis not present

## 2017-11-18 DIAGNOSIS — R350 Frequency of micturition: Secondary | ICD-10-CM | POA: Diagnosis not present

## 2017-12-14 DIAGNOSIS — Z1389 Encounter for screening for other disorder: Secondary | ICD-10-CM | POA: Diagnosis not present

## 2017-12-14 DIAGNOSIS — E2839 Other primary ovarian failure: Secondary | ICD-10-CM | POA: Diagnosis not present

## 2017-12-14 DIAGNOSIS — M8589 Other specified disorders of bone density and structure, multiple sites: Secondary | ICD-10-CM | POA: Diagnosis not present

## 2017-12-14 DIAGNOSIS — M85852 Other specified disorders of bone density and structure, left thigh: Secondary | ICD-10-CM | POA: Diagnosis not present

## 2019-01-19 ENCOUNTER — Ambulatory Visit (HOSPITAL_COMMUNITY)
Admission: RE | Admit: 2019-01-19 | Discharge: 2019-01-19 | Disposition: A | Discharge status: Home / Self Care | Payer: BC Managed Care – PPO | Source: Ambulatory Visit | Attending: Physician Assistant | Admitting: Physician Assistant

## 2019-01-19 ENCOUNTER — Other Ambulatory Visit (HOSPITAL_COMMUNITY): Payer: Self-pay | Admitting: Physician Assistant

## 2019-01-19 ENCOUNTER — Other Ambulatory Visit: Payer: Self-pay

## 2019-01-19 DIAGNOSIS — R079 Chest pain, unspecified: Secondary | ICD-10-CM | POA: Diagnosis not present

## 2019-01-19 DIAGNOSIS — E063 Autoimmune thyroiditis: Secondary | ICD-10-CM | POA: Diagnosis not present

## 2019-01-19 DIAGNOSIS — Z1231 Encounter for screening mammogram for malignant neoplasm of breast: Secondary | ICD-10-CM

## 2019-01-19 DIAGNOSIS — Z6823 Body mass index (BMI) 23.0-23.9, adult: Secondary | ICD-10-CM | POA: Diagnosis not present

## 2019-01-19 DIAGNOSIS — Z0001 Encounter for general adult medical examination with abnormal findings: Secondary | ICD-10-CM | POA: Diagnosis not present

## 2019-01-19 DIAGNOSIS — M1991 Primary osteoarthritis, unspecified site: Secondary | ICD-10-CM | POA: Diagnosis not present

## 2019-01-23 ENCOUNTER — Other Ambulatory Visit (HOSPITAL_COMMUNITY): Payer: Self-pay | Admitting: Physician Assistant

## 2019-01-23 DIAGNOSIS — N63 Unspecified lump in unspecified breast: Secondary | ICD-10-CM

## 2019-01-31 ENCOUNTER — Ambulatory Visit (HOSPITAL_COMMUNITY)
Admission: RE | Admit: 2019-01-31 | Discharge: 2019-01-31 | Disposition: A | Discharge status: Home / Self Care | Payer: BC Managed Care – PPO | Source: Ambulatory Visit | Attending: Physician Assistant | Admitting: Physician Assistant

## 2019-01-31 ENCOUNTER — Other Ambulatory Visit: Payer: Self-pay

## 2019-01-31 DIAGNOSIS — N63 Unspecified lump in unspecified breast: Secondary | ICD-10-CM | POA: Diagnosis not present

## 2019-01-31 DIAGNOSIS — N6489 Other specified disorders of breast: Secondary | ICD-10-CM | POA: Diagnosis not present

## 2019-01-31 DIAGNOSIS — R922 Inconclusive mammogram: Secondary | ICD-10-CM | POA: Diagnosis not present

## 2019-03-01 ENCOUNTER — Ambulatory Visit: Payer: BC Managed Care – PPO | Admitting: Cardiology

## 2019-03-14 NOTE — Progress Notes (Signed)
Cardiology Office Note   Date:  03/15/2019   ID:  Kayla Cervantes, DOB [birth year], MRN [id number]  PCP:  Redmond School, MD  Cardiologist:   No primary care provider on file. Referring:  Redmond School, MD  No chief complaint on file.     History of Present Illness: Kayla Cervantes is a 58 y.o. female who is referred by Redmond School, MD for evaluation of chest pain.    Patient has a history of chest pain he had a cardiac catheterization in 2002 that was apparently negative though I did not see the actual report.  I do see that she had chest pain again in 2016.  She had an echocardiogram with a well-preserved ejection fraction.  This followed a perfusion study that was negative for ischemia but suggested a mildly low ejection fraction.  She presents for new patient evaluation.  She says she has been getting chest discomfort and I think she had a significant episode in in August.  She says that this happens as a sharp discomfort.  It is mid chest.  It might radiate to her shoulder.  She might get some choking.  She had some jaw discomfort.  It is 8 out of 10 in intensity.  It lasted 5 to 10 minutes.  It comes on spontaneously and goes away spontaneously.  She might get sweaty and short of breath.  She cannot associate it with food.  It does not come on with physical activity.  She has a physical job and does not bring this on.  The last severe episode was August.  Is not clear whether she has had more mild episodes since then.  She thinks this might be the same discomfort she had previously but is difficult to know.  Past Medical History:  Diagnosis Date  . ? ofCIN II (cervical intraepithelial neoplasia II) on ECC 04/08/2015   Pap cin I, -> Colpo NO visibled dysplasia-> ECC CIN I possible CIN-II , --> LEEP.   . Chest pain 09/22/2011   2D Echo EF>55%  . Graves disease   . HSIL (high grade squamous intraepithelial lesion) on Pap smear of cervix 03/30/2015  . Hypothyroidism   . Severe  dysplasia of cervix (CIN III) 05/07/2015   Residual CIN III on endocervical margin status post: [place]   . Tobacco use disorder 01/14/2015  . Vaginal Pap smear, abnormal     Past Surgical History:  Procedure Laterality Date  . ABDOMINAL HYSTERECTOMY N/A 05/14/2015   Procedure: HYSTERECTOMY ABDOMINAL;  Surgeon: Jonnie Kind, MD;  Location: [facility];  Service: Gynecology;  Laterality: N/A;  . BACK SURGERY    . BREAST SURGERY     X 5-benign lumpectomies  . CARDIAC CATHETERIZATION  12/31/2000   for chest pain EF 60%  . CESAREAN SECTION    . COLONOSCOPY    . COLONOSCOPY N/A 03/18/2015   Procedure: COLONOSCOPY;  Surgeon: Daneil Dolin, MD;  Location: [facility];  Service: Endoscopy;  Laterality: N/A;  10:30 AM  . ESOPHAGOGASTRODUODENOSCOPY    . Left knee arthroscopy    . SALPINGOOPHORECTOMY Bilateral 05/14/2015   Procedure: BILATERAL SALPINGO OOPHORECTOMY;  Surgeon: Jonnie Kind, MD;  Location: [facility];  Service: Gynecology;  Laterality: Bilateral;  . SCAR REVISION N/A 05/14/2015   Procedure: WIDE EXCISION OF OLD CICATRIX;  Surgeon: Jonnie Kind, MD;  Location: [facility];  Service: Gynecology;  Laterality: N/A;     Current Outpatient Medications  Medication Sig Dispense Refill  .  acetaminophen (TYLENOL) 500 MG tablet Take 1,000 mg by mouth every 6 (six) hours as needed for mild pain.    Marland Kitchen. estradiol (ESTRACE) 1 MG tablet TAKE 1 TABLET BY MOUTH ONCE DAILY 90 tablet 3  . levothyroxine (SYNTHROID) 200 MCG tablet Take 200 mcg by mouth daily before breakfast.    . nitroGLYCERIN (NITROSTAT) 0.4 MG SL tablet DISSOLVE ONE TABLET UNDER THE TONGUE EVERY 5 TO 10 MINUTES PRIOR TO ACTIVITIES WHICH MIGHT PRECIPITATE AN ATTACK OR PERSISTENT CHEST PRESSUR    . metoprolol tartrate (LOPRESSOR) 100 MG tablet Take 1 tablet (100 mg total) by mouth once for 1 dose. TAKE ONE TABLET 2 HOURS BEFORE YOUR CORONARY CT 1 tablet 0   No current facility-administered medications for this visit.      Allergies:   Patient has no known allergies.    Social History:  The patient  reports that she has been smoking cigarettes. She has a 35.00 pack-year smoking history. She has never used smokeless tobacco. She reports that she does not drink alcohol or use drugs.   Family History:  The patient's family history includes Cancer (age of onset: 6280) in her paternal grandfather; Colon cancer in her paternal grandfather; Diabetes (age of onset: 5879) in her paternal grandmother; Heart attack (age of onset: 3038) in her father; Heart disease (age of onset: 2480) in her mother; Stroke in her maternal grandmother; Thyroid disease (age of onset: 519) in her brother.    ROS:  Please see the history of present illness.   Otherwise, review of systems are positive for none.   All other systems are reviewed and negative.    PHYSICAL EXAM: VS:  BP 110/78   Pulse 79   Ht 5\' 4"  (1.626 m)   Wt 136 lb (61.7 kg)   BMI 23.34 kg/m  , BMI Body mass index is 23.34 kg/m. GENERAL:  Well appearing HEENT:  Pupils equal round and reactive, fundi not visualized, oral mucosa unremarkable NECK:  No jugular venous distention, waveform within normal limits, carotid upstroke brisk and symmetric, no bruits, no thyromegaly LYMPHATICS:  No cervical, inguinal adenopathy LUNGS:  Clear to auscultation bilaterally BACK:  No CVA tenderness CHEST:  Unremarkable HEART:  PMI not displaced or sustained,S1 and S2 within normal limits, no S3, no S4, no clicks, no rubs, no murmurs ABD:  Flat, positive bowel sounds normal in frequency in pitch, no bruits, no rebound, no guarding, no midline pulsatile mass, no hepatomegaly, no splenomegaly EXT:  2 plus pulses upper, decreased dorsalis pedis and posterior tibialis bilateral lower, no edema, no cyanosis no clubbing SKIN:  No rashes no nodules NEURO:  Cranial nerves II through XII grossly intact, motor grossly intact throughout PSYCH:  Cognitively intact, oriented to person place and time     EKG:  EKG is ordered today. The ekg ordered today demonstrates sinus rhythm, rate 79, axis within normal limits, intervals within normal limits, poor anterior R wave progression, no acute ST-T wave changes.   Recent Labs: No results found for requested labs within last 8760 hours.    Lipid Panel    Component Value Date/Time   CHOL 206 (H) 01/15/2015 0056   TRIG 212 (H) 01/15/2015 0056   HDL 46 01/15/2015 0056   CHOLHDL 4.5 01/15/2015 0056   VLDL 42 (H) 01/15/2015 0056   LDLCALC 118 (H) 01/15/2015 0056      Wt Readings from Last 3 Encounters:  03/15/19 136 lb (61.7 kg)  06/04/16 150 lb (68 kg)  07/05/15 148  lb (67.1 kg)      Other studies Reviewed: Additional studies/ records that were reviewed today include: Previous perfusion study, echo. Review of the above records demonstrates:  Please see elsewhere in the note.     ASSESSMENT AND PLAN:  CHEST PAIN: Her chest pain is somewhat concerning for obstructive coronary disease although she has not had recent symptoms.  She certainly has risk factors.  There are some atypical features as well.  I think the pretest probability for obstructive coronary disease is at least moderate.  Coronary CTA would be indicated.  She needs the additional sensitivity of imaging.  Further evaluation management will be based on these results.  She is to present to the emergency room if she has another severe episodes that she had in August.  TOBACCO: At this time she is not able to stop smoking.  We talked about this and I will continue education.  LEG PAIN: She might have claudication.  She has decreased pulses.  I will check ABIs.  DYSLIPIDEMIA: Her LDL was 174.  This is a new finding for her as it is always been low.  She might agree to take a statin based on the results of the CT and I will wait for these results.   Current medicines are reviewed at length with the patient today.  The patient does not have concerns regarding medicines.  The  following changes have been made:  no change  Labs/ tests ordered today include:   Orders Placed This Encounter  Procedures  . CT CORONARY MORPH W/CTA COR W/SCORE W/CA W/CM &/OR WO/CM  . CT CORONARY FRACTIONAL FLOW RESERVE DATA PREP  . CT CORONARY FRACTIONAL FLOW RESERVE FLUID ANALYSIS  . Basic metabolic panel  . EKG 12-Lead  . VAS [place] LOWER EXTREMITY ARTERIAL DUPLEX     Disposition:   FU with me after the CT and ABIs.     Signed, Rollene Rotunda, MD  03/15/2019 5:25 PM    [facility]

## 2019-03-15 ENCOUNTER — Other Ambulatory Visit: Payer: Self-pay

## 2019-03-15 ENCOUNTER — Encounter: Payer: Self-pay | Admitting: Cardiology

## 2019-03-15 ENCOUNTER — Encounter: Payer: Self-pay | Admitting: *Deleted

## 2019-03-15 ENCOUNTER — Ambulatory Visit (INDEPENDENT_AMBULATORY_CARE_PROVIDER_SITE_OTHER): Payer: BC Managed Care – PPO | Admitting: Cardiology

## 2019-03-15 VITALS — BP 110/78 | HR 79 | Ht 64.0 in | Wt 136.0 lb

## 2019-03-15 DIAGNOSIS — M79605 Pain in left leg: Secondary | ICD-10-CM

## 2019-03-15 DIAGNOSIS — M79604 Pain in right leg: Secondary | ICD-10-CM

## 2019-03-15 DIAGNOSIS — Z01812 Encounter for preprocedural laboratory examination: Secondary | ICD-10-CM

## 2019-03-15 DIAGNOSIS — R079 Chest pain, unspecified: Secondary | ICD-10-CM | POA: Diagnosis not present

## 2019-03-15 DIAGNOSIS — F17200 Nicotine dependence, unspecified, uncomplicated: Secondary | ICD-10-CM

## 2019-03-15 DIAGNOSIS — I739 Peripheral vascular disease, unspecified: Secondary | ICD-10-CM

## 2019-03-15 MED ORDER — METOPROLOL TARTRATE 100 MG PO TABS: 100.0000 mg | ORAL_TABLET | Freq: Once | ORAL | 0 refills | Status: AC

## 2019-03-15 NOTE — Patient Instructions (Addendum)
Medication Instructions:  The current medical regimen is effective;  continue present plan and medications.  Take Lopressor 100 mg (1) tablet 2 hours before your Coronary CT scan.  If you need a refill on your cardiac medications before your next appointment, please call your pharmacy.   Lab work: Please have blood work before your Coronary CT  (BMP) If you have labs (blood work) drawn today and your tests are completely normal, you will receive your results only by: Marland Kitchen MyChart Message (if you have MyChart) OR . A paper copy in the mail If you have any lab test that is abnormal or we need to change your treatment, we will call you to review the results.  Testing/Procedures: Your physician has requested that you have a lower extremity arterial exercise duplex. During this test, exercise and ultrasound are used to evaluate arterial blood flow in the legs. Allow one hour for this exam. There are no restrictions or special instructions.  Your physician has requested that you have Coronary CT. Cardiac computed tomography (CT) is a painless test that uses an x-ray machine to take clear, detailed pictures of your heart. For further information please visit [email or web address][email or web address]. Please follow instruction sheet as given.  Follow-Up: Follow up after the above testing has been completed.  Thank you for choosing [facility]!!    Your cardiac CT will be scheduled at one of the below locations:   [facility] [address] [place], [place] [address] [phone]  Please arrive at the [facility] main entrance of [facility] 30-45 minutes prior to test start time. Proceed to the [facility] Radiology Department (first floor) to check-in and test prep.  Please follow these instructions carefully (unless otherwise directed):  Hold all erectile dysfunction medications at least 3 days (72 hrs) prior to test.  On the Night Before the Test: . Be sure to Drink plenty  of water. . Do not consume any caffeinated/decaffeinated beverages or chocolate 12 hours prior to your test. . Do not take any antihistamines 12 hours prior to your test.  On the Day of the Test: . Drink plenty of water. Do not drink any water within one hour of the test. . Do not eat any food 4 hours prior to the test. . You may take your regular medications prior to the test.  . Take metoprolol (Lopressor) two hours prior to test. . HOLD Furosemide/Hydrochlorothiazide morning of the test. . FEMALES- please wear underwire-free bra if available     After the Test: . Drink plenty of water. . After receiving IV contrast, you may experience a mild flushed feeling. This is normal. . On occasion, you may experience a mild rash up to 24 hours after the test. This is not dangerous. If this occurs, you can take Benadryl 25 mg and increase your fluid intake. . If you experience trouble breathing, this can be serious. If it is severe call 911 IMMEDIATELY. If it is mild, please call our office. . If you take any of these medications: Glipizide/Metformin, Avandament, Glucavance, please do not take 48 hours after completing test unless otherwise instructed.  Please contact the cardiac imaging nurse navigator should you have any questions/concerns Marchia Bond, RN Navigator Cardiac Imaging [facility] [phone] Office

## 2019-03-16 ENCOUNTER — Other Ambulatory Visit: Payer: Self-pay | Admitting: Cardiology

## 2019-03-16 DIAGNOSIS — M79604 Pain in right leg: Secondary | ICD-10-CM

## 2019-03-16 DIAGNOSIS — F17200 Nicotine dependence, unspecified, uncomplicated: Secondary | ICD-10-CM

## 2019-03-16 DIAGNOSIS — M79605 Pain in left leg: Secondary | ICD-10-CM

## 2019-03-17 ENCOUNTER — Other Ambulatory Visit: Payer: Self-pay | Admitting: Cardiology

## 2019-03-17 ENCOUNTER — Other Ambulatory Visit: Payer: Self-pay

## 2019-03-17 ENCOUNTER — Ambulatory Visit (HOSPITAL_COMMUNITY)
Admission: RE | Admit: 2019-03-17 | Discharge: 2019-03-17 | Disposition: A | Discharge status: Home / Self Care | Payer: BC Managed Care – PPO | Source: Ambulatory Visit | Attending: Cardiovascular Disease | Admitting: Cardiovascular Disease

## 2019-03-17 DIAGNOSIS — M79604 Pain in right leg: Secondary | ICD-10-CM | POA: Insufficient documentation

## 2019-03-17 DIAGNOSIS — M79605 Pain in left leg: Secondary | ICD-10-CM | POA: Diagnosis not present

## 2019-03-17 DIAGNOSIS — F17200 Nicotine dependence, unspecified, uncomplicated: Secondary | ICD-10-CM

## 2019-03-22 ENCOUNTER — Telehealth: Payer: Self-pay | Admitting: Cardiology

## 2019-03-22 NOTE — Telephone Encounter (Signed)
LEA results - left detailed message on name-verified VM  Notes recorded by Minus Breeding, MD on 03/19/2019 at 2:21 PM EDT  No evidence of PVD.

## 2019-03-22 NOTE — Telephone Encounter (Signed)
Follow Up:       Pt is returning  Michelle's call from yesterday. 

## 2019-03-22 NOTE — Telephone Encounter (Signed)
Spoke with pt and went over results.  Pt verbalized understanding.  

## 2019-04-04 ENCOUNTER — Other Ambulatory Visit: Payer: Self-pay

## 2019-04-04 DIAGNOSIS — R079 Chest pain, unspecified: Secondary | ICD-10-CM

## 2019-04-04 DIAGNOSIS — R0789 Other chest pain: Secondary | ICD-10-CM

## 2019-04-04 NOTE — Progress Notes (Signed)
bmet  

## 2019-04-12 ENCOUNTER — Other Ambulatory Visit: Payer: Self-pay | Admitting: *Deleted

## 2019-04-12 DIAGNOSIS — Z01812 Encounter for preprocedural laboratory examination: Secondary | ICD-10-CM

## 2019-04-12 DIAGNOSIS — R079 Chest pain, unspecified: Secondary | ICD-10-CM

## 2019-04-13 DIAGNOSIS — E063 Autoimmune thyroiditis: Secondary | ICD-10-CM | POA: Diagnosis not present

## 2019-04-13 DIAGNOSIS — R0789 Other chest pain: Secondary | ICD-10-CM | POA: Diagnosis not present

## 2019-04-13 DIAGNOSIS — R079 Chest pain, unspecified: Secondary | ICD-10-CM | POA: Diagnosis not present

## 2019-04-14 ENCOUNTER — Encounter: Payer: Self-pay | Admitting: *Deleted

## 2019-04-14 LAB — BASIC METABOLIC PANEL
BUN/Creatinine Ratio: 15 (ref 9–23)
BUN: 10 mg/dL (ref 6–24)
CO2: 21 mmol/L (ref 20–29)
Calcium: 9.4 mg/dL (ref 8.7–10.2)
Chloride: 100 mmol/L (ref 96–106)
Creatinine, Ser: 0.68 mg/dL (ref 0.57–1.00)
GFR calc Af Amer: 113 mL/min/{1.73_m2} (ref >59)
GFR calc non Af Amer: 98 mL/min/{1.73_m2} (ref >59)
Glucose: 89 mg/dL (ref 65–99)
Potassium: 4.6 mmol/L (ref 3.5–5.2)
Sodium: 135 mmol/L (ref 134–144)

## 2019-04-19 ENCOUNTER — Telehealth (HOSPITAL_COMMUNITY): Payer: Self-pay | Admitting: Emergency Medicine

## 2019-04-19 NOTE — Telephone Encounter (Signed)
Reaching out to patient to offer assistance regarding upcoming cardiac imaging study; pt verbalizes understanding of appt date/time, parking situation and where to check in, pre-test NPO status and medications ordered, and verified current allergies; name and call back number provided for further questions should they arise Rilen Shukla RN Navigator Cardiac Imaging [facility] [phone] office [phone] cell 

## 2019-04-20 ENCOUNTER — Ambulatory Visit (HOSPITAL_COMMUNITY)
Admission: RE | Admit: 2019-04-20 | Discharge: 2019-04-20 | Disposition: A | Discharge status: Home / Self Care | Payer: BC Managed Care – PPO | Source: Ambulatory Visit | Attending: Cardiology | Admitting: Cardiology

## 2019-04-20 ENCOUNTER — Other Ambulatory Visit: Payer: Self-pay

## 2019-04-20 DIAGNOSIS — F17200 Nicotine dependence, unspecified, uncomplicated: Secondary | ICD-10-CM | POA: Diagnosis not present

## 2019-04-20 DIAGNOSIS — R079 Chest pain, unspecified: Secondary | ICD-10-CM | POA: Diagnosis not present

## 2019-04-20 MED ORDER — NITROGLYCERIN 0.4 MG SL SUBL
0.8000 mg | SUBLINGUAL_TABLET | SUBLINGUAL | Status: DC | PRN
  Administered 2019-04-20: 0.8 mg via SUBLINGUAL

## 2019-04-20 MED ORDER — IOHEXOL 350 MG/ML SOLN
80.0000 mL | Freq: Once | INTRAVENOUS | Status: AC | PRN
  Administered 2019-04-20: 80 mL via INTRAVENOUS

## 2019-04-20 MED ORDER — NITROGLYCERIN 0.4 MG SL SUBL
SUBLINGUAL_TABLET | SUBLINGUAL | Status: AC
  Filled 2019-04-20: qty 1

## 2019-10-15 DIAGNOSIS — F17200 Nicotine dependence, unspecified, uncomplicated: Secondary | ICD-10-CM | POA: Diagnosis not present

## 2019-10-15 DIAGNOSIS — R05 Cough: Secondary | ICD-10-CM | POA: Diagnosis not present

## 2019-10-15 DIAGNOSIS — R0602 Shortness of breath: Secondary | ICD-10-CM | POA: Diagnosis not present

## 2019-10-15 DIAGNOSIS — J189 Pneumonia, unspecified organism: Secondary | ICD-10-CM | POA: Diagnosis not present

## 2020-04-01 ENCOUNTER — Encounter: Payer: Self-pay | Admitting: Internal Medicine

## 2020-05-02 DIAGNOSIS — R200 Anesthesia of skin: Secondary | ICD-10-CM | POA: Diagnosis not present

## 2020-05-02 DIAGNOSIS — M5412 Radiculopathy, cervical region: Secondary | ICD-10-CM | POA: Diagnosis not present

## 2020-05-02 DIAGNOSIS — M79601 Pain in right arm: Secondary | ICD-10-CM | POA: Diagnosis not present

## 2020-05-03 DIAGNOSIS — M79601 Pain in right arm: Secondary | ICD-10-CM | POA: Diagnosis not present

## 2020-05-13 DIAGNOSIS — Z6821 Body mass index (BMI) 21.0-21.9, adult: Secondary | ICD-10-CM | POA: Diagnosis not present

## 2020-05-13 DIAGNOSIS — Z1389 Encounter for screening for other disorder: Secondary | ICD-10-CM | POA: Diagnosis not present

## 2020-05-13 DIAGNOSIS — Z0000 Encounter for general adult medical examination without abnormal findings: Secondary | ICD-10-CM | POA: Diagnosis not present

## 2020-05-13 DIAGNOSIS — Z1331 Encounter for screening for depression: Secondary | ICD-10-CM | POA: Diagnosis not present

## 2020-05-13 DIAGNOSIS — M5030 Other cervical disc degeneration, unspecified cervical region: Secondary | ICD-10-CM | POA: Diagnosis not present

## 2020-05-13 DIAGNOSIS — E785 Hyperlipidemia, unspecified: Secondary | ICD-10-CM | POA: Diagnosis not present

## 2020-05-13 DIAGNOSIS — Z13220 Encounter for screening for lipoid disorders: Secondary | ICD-10-CM | POA: Diagnosis not present

## 2020-05-13 DIAGNOSIS — M5020 Other cervical disc displacement, unspecified cervical region: Secondary | ICD-10-CM | POA: Diagnosis not present

## 2020-05-13 DIAGNOSIS — E063 Autoimmune thyroiditis: Secondary | ICD-10-CM | POA: Diagnosis not present

## 2020-06-05 DIAGNOSIS — E063 Autoimmune thyroiditis: Secondary | ICD-10-CM | POA: Diagnosis not present

## 2020-06-05 DIAGNOSIS — M47813 Spondylosis without myelopathy or radiculopathy, cervicothoracic region: Secondary | ICD-10-CM | POA: Diagnosis not present

## 2020-06-05 DIAGNOSIS — M5030 Other cervical disc degeneration, unspecified cervical region: Secondary | ICD-10-CM | POA: Diagnosis not present

## 2020-06-05 DIAGNOSIS — M542 Cervicalgia: Secondary | ICD-10-CM | POA: Diagnosis not present

## 2020-06-05 DIAGNOSIS — M5020 Other cervical disc displacement, unspecified cervical region: Secondary | ICD-10-CM | POA: Diagnosis not present

## 2020-06-05 DIAGNOSIS — M9971 Connective tissue and disc stenosis of intervertebral foramina of cervical region: Secondary | ICD-10-CM | POA: Diagnosis not present

## 2020-08-13 DIAGNOSIS — Z79899 Other long term (current) drug therapy: Secondary | ICD-10-CM | POA: Diagnosis not present

## 2020-08-13 DIAGNOSIS — G629 Polyneuropathy, unspecified: Secondary | ICD-10-CM | POA: Diagnosis not present

## 2020-08-13 DIAGNOSIS — M792 Neuralgia and neuritis, unspecified: Secondary | ICD-10-CM | POA: Diagnosis not present

## 2020-08-13 DIAGNOSIS — M25511 Pain in right shoulder: Secondary | ICD-10-CM | POA: Diagnosis not present

## 2020-08-15 DIAGNOSIS — G5621 Lesion of ulnar nerve, right upper limb: Secondary | ICD-10-CM | POA: Diagnosis not present

## 2020-08-15 DIAGNOSIS — R030 Elevated blood-pressure reading, without diagnosis of hypertension: Secondary | ICD-10-CM | POA: Diagnosis not present

## 2020-08-15 DIAGNOSIS — M542 Cervicalgia: Secondary | ICD-10-CM | POA: Diagnosis not present

## 2020-09-02 DIAGNOSIS — R200 Anesthesia of skin: Secondary | ICD-10-CM | POA: Diagnosis not present

## 2020-09-02 DIAGNOSIS — M4802 Spinal stenosis, cervical region: Secondary | ICD-10-CM | POA: Diagnosis not present

## 2020-11-14 ENCOUNTER — Emergency Department (HOSPITAL_COMMUNITY): Payer: BC Managed Care – PPO

## 2020-11-14 ENCOUNTER — Emergency Department (HOSPITAL_COMMUNITY)
Admission: EM | Admit: 2020-11-14 | Discharge: 2020-11-14 | Disposition: A | Discharge status: Home / Self Care | Payer: BC Managed Care – PPO | Attending: Emergency Medicine | Admitting: Emergency Medicine

## 2020-11-14 ENCOUNTER — Encounter (HOSPITAL_COMMUNITY): Payer: Self-pay | Admitting: *Deleted

## 2020-11-14 ENCOUNTER — Other Ambulatory Visit: Payer: Self-pay

## 2020-11-14 DIAGNOSIS — R0789 Other chest pain: Secondary | ICD-10-CM | POA: Diagnosis not present

## 2020-11-14 DIAGNOSIS — R519 Headache, unspecified: Secondary | ICD-10-CM | POA: Diagnosis not present

## 2020-11-14 DIAGNOSIS — F17210 Nicotine dependence, cigarettes, uncomplicated: Secondary | ICD-10-CM | POA: Diagnosis not present

## 2020-11-14 DIAGNOSIS — Z79899 Other long term (current) drug therapy: Secondary | ICD-10-CM | POA: Diagnosis not present

## 2020-11-14 DIAGNOSIS — R55 Syncope and collapse: Secondary | ICD-10-CM | POA: Insufficient documentation

## 2020-11-14 DIAGNOSIS — R61 Generalized hyperhidrosis: Secondary | ICD-10-CM | POA: Insufficient documentation

## 2020-11-14 DIAGNOSIS — R079 Chest pain, unspecified: Secondary | ICD-10-CM | POA: Diagnosis not present

## 2020-11-14 DIAGNOSIS — R42 Dizziness and giddiness: Secondary | ICD-10-CM | POA: Diagnosis not present

## 2020-11-14 DIAGNOSIS — R0781 Pleurodynia: Secondary | ICD-10-CM | POA: Diagnosis not present

## 2020-11-14 DIAGNOSIS — E039 Hypothyroidism, unspecified: Secondary | ICD-10-CM | POA: Diagnosis not present

## 2020-11-14 DIAGNOSIS — R0602 Shortness of breath: Secondary | ICD-10-CM | POA: Insufficient documentation

## 2020-11-14 LAB — D-DIMER, QUANTITATIVE: D-Dimer, Quant: 0.35 ug/mL-FEU (ref 0.00–0.50)

## 2020-11-14 LAB — BASIC METABOLIC PANEL
Anion gap: 10 (ref 5–15)
BUN: 14 mg/dL (ref 6–20)
CO2: 24 mmol/L (ref 22–32)
Calcium: 9.2 mg/dL (ref 8.9–10.3)
Chloride: 100 mmol/L (ref 98–111)
Creatinine, Ser: 0.65 mg/dL (ref 0.44–1.00)
GFR, Estimated: >60 mL/min (ref >60)
Glucose, Bld: 106 mg/dL — ABNORMAL HIGH (ref 70–99)
Potassium: 4.1 mmol/L (ref 3.5–5.1)
Sodium: 134 mmol/L — ABNORMAL LOW (ref 135–145)

## 2020-11-14 LAB — CBC WITH DIFFERENTIAL/PLATELET
Abs Immature Granulocytes: 0.06 10*3/uL (ref 0.00–0.07)
Basophils Absolute: 0 10*3/uL (ref 0.0–0.1)
Basophils Relative: 0 %
Eosinophils Absolute: 0.2 10*3/uL (ref 0.0–0.5)
Eosinophils Relative: 1 %
HCT: 44.5 % (ref 36.0–46.0)
Hemoglobin: 14.7 g/dL (ref 12.0–15.0)
Immature Granulocytes: 1 %
Lymphocytes Relative: 18 %
Lymphs Abs: 2.2 10*3/uL (ref 0.7–4.0)
MCH: 31.4 pg (ref 26.0–34.0)
MCHC: 33 g/dL (ref 30.0–36.0)
MCV: 95.1 fL (ref 80.0–100.0)
Monocytes Absolute: 1.1 10*3/uL — ABNORMAL HIGH (ref 0.1–1.0)
Monocytes Relative: 9 %
Neutro Abs: 8.5 10*3/uL — ABNORMAL HIGH (ref 1.7–7.7)
Neutrophils Relative %: 71 %
Platelets: 267 10*3/uL (ref 150–400)
RBC: 4.68 MIL/uL (ref 3.87–5.11)
RDW: 12.9 % (ref 11.5–15.5)
WBC: 12 10*3/uL — ABNORMAL HIGH (ref 4.0–10.5)
nRBC: 0 % (ref 0.0–0.2)

## 2020-11-14 LAB — TROPONIN I (HIGH SENSITIVITY)
Troponin I (High Sensitivity): <2 ng/L (ref <18)
Troponin I (High Sensitivity): 2 ng/L (ref <18)

## 2020-11-14 MED ORDER — ACETAMINOPHEN 325 MG PO TABS
650.0000 mg | ORAL_TABLET | Freq: Once | ORAL | Status: AC
  Administered 2020-11-14: 650 mg via ORAL
  Filled 2020-11-14: qty 2

## 2020-11-14 NOTE — ED Provider Notes (Signed)
[facility] EMERGENCY DEPARTMENT Provider Note   CSN: [id number] Arrival date & time: 11/14/20  1212     History Chief Complaint  Patient presents with   Tremors   Chest Pain    Kayla Cervantes is a 60 y.o. female.  She is a smoker.  She is complaining of an episode yesterday and again today of acute diaphoresis and feeling like she might pass out.  She had some aching in her ribs worse with taking a deep breath.  She felt tremulous.  She feels she has been drinking plenty of fluids and is used to the heat so she does not think it is that.  She is taking her regular medications.  She denies any alcohol or drugs.  Symptoms since arrival have improved.   Chest Pain Chest pain location: lower ribs bilat. Pain quality: aching   Pain radiates to:  Does not radiate Pain severity:  Moderate Onset quality:  Sudden Duration:  1 hour Progression:  Improving Chronicity:  New Context: at rest   Relieved by:  None tried Worsened by:  Deep breathing Ineffective treatments:  None tried Associated symptoms: diaphoresis, dizziness, headache, near-syncope and shortness of breath   Associated symptoms: no abdominal pain, no cough, no fever, no lower extremity edema and no vomiting   Risk factors: smoking   Risk factors: no coronary artery disease, no diabetes mellitus, no high cholesterol, no hypertension and no immobilization       Past Medical History:  Diagnosis Date   ? ofCIN II (cervical intraepithelial neoplasia II) on ECC 04/08/2015   Pap cin I, -> Colpo NO visibled dysplasia-> ECC CIN I possible CIN-II , --> LEEP.    Chest pain 09/22/2011   2D Echo EF>55%   Graves disease    HSIL (high grade squamous intraepithelial lesion) on Pap smear of cervix 03/30/2015   Hypothyroidism    Severe dysplasia of cervix (CIN III) 05/07/2015   Residual CIN III on endocervical margin status post: Conization    Tobacco use disorder 01/14/2015   Vaginal Pap smear, abnormal     Patient Active Problem  List   Diagnosis Date Noted   S/P abdominal hysterectomy 06/19/2015   Status post hysterectomy with oophorectomy 05/14/2015   Severe dysplasia of cervix (CIN III) 05/07/2015   ? ofCIN II (cervical intraepithelial neoplasia II) on ECC 04/08/2015   HSIL (high grade squamous intraepithelial lesion) on Pap smear of cervix 03/30/2015   Special screening for malignant neoplasms, colon    Other chest pain    Chest pain 01/14/2015   Hypothyroidism 01/14/2015   Tobacco use disorder 01/14/2015    Past Surgical History:  Procedure Laterality Date   ABDOMINAL HYSTERECTOMY N/A 05/14/2015   Procedure: HYSTERECTOMY ABDOMINAL;  Surgeon: Tilda BurrowJohn Ferguson V, MD;  Location: [facility];  Service: Gynecology;  Laterality: N/A;   BACK SURGERY     BREAST SURGERY     X 5-benign lumpectomies   CARDIAC CATHETERIZATION  12/31/2000   for chest pain EF 60%   CESAREAN SECTION     COLONOSCOPY     COLONOSCOPY N/A 03/18/2015   Procedure: COLONOSCOPY;  Surgeon: Corbin Adeobert M Rourk, MD;  Location: [facility];  Service: Endoscopy;  Laterality: N/A;  10:30 AM   ESOPHAGOGASTRODUODENOSCOPY     Left knee arthroscopy     SALPINGOOPHORECTOMY Bilateral 05/14/2015   Procedure: BILATERAL SALPINGO OOPHORECTOMY;  Surgeon: Tilda BurrowJohn Ferguson V, MD;  Location: [facility];  Service: Gynecology;  Laterality: Bilateral;   SCAR REVISION N/A 05/14/2015  Procedure: WIDE EXCISION OF OLD CICATRIX;  Surgeon: [name], MD;  Location: [facility];  Service: Gynecology;  Laterality: N/A;     OB History     Gravida  4   Para  4   Term  2   Preterm  2   AB      Living  4      SAB      IAB      Ectopic      Multiple      Live Births              Family History  Problem Relation Age of Onset   Heart disease Mother 70       lung disease ( at [facility])   Heart attack Father 58       deceased   Stroke Maternal Grandmother        deceased, heart failure   Diabetes Paternal Grandmother 33       deceased   Cancer Paternal  Grandfather 18       deceased   Colon cancer Paternal Grandfather    Thyroid disease Brother 72       deceased bleeding at surgery    Social History   Tobacco Use   Smoking status: Every Day    Packs/day: 1.00    Years: 35.00    Pack years: 35.00    Types: Cigarettes   Smokeless tobacco: Never  Substance Use Topics   Alcohol use: No    Alcohol/week: 0.0 standard drinks   Drug use: No    Comment: Heavy caffeine.     Home Medications Prior to Admission medications   Medication Sig Start Date End Date Taking? Authorizing Provider  acetaminophen (TYLENOL) 500 MG tablet Take 1,000 mg by mouth every 6 (six) hours as needed for mild pain.    [provider]  estradiol (ESTRACE) 1 MG tablet TAKE 1 TABLET BY MOUTH ONCE DAILY 11/01/17   [name], MD  levothyroxine (SYNTHROID) 200 MCG tablet Take 200 mcg by mouth daily before breakfast.    [provider]  metoprolol tartrate (LOPRESSOR) 100 MG tablet Take 1 tablet (100 mg total) by mouth once for 1 dose. TAKE ONE TABLET 2 HOURS BEFORE YOUR CORONARY CT 03/15/19 03/15/19  Rollene Rotunda, MD  nitroGLYCERIN (NITROSTAT) 0.4 MG SL tablet DISSOLVE ONE TABLET UNDER THE TONGUE EVERY 5 TO 10 MINUTES PRIOR TO ACTIVITIES WHICH MIGHT PRECIPITATE AN ATTACK OR PERSISTENT CHEST PRESSUR 02/07/19   [provider]    Allergies    Patient has no known allergies.  Review of Systems   Review of Systems  Constitutional:  Positive for diaphoresis. Negative for fever.  HENT:  Negative for sore throat.   Eyes:  Negative for visual disturbance.  Respiratory:  Positive for shortness of breath. Negative for cough.   Cardiovascular:  Positive for chest pain and near-syncope.  Gastrointestinal:  Negative for abdominal pain and vomiting.  Genitourinary:  Negative for dysuria.  Musculoskeletal:  Negative for neck pain.  Skin:  Negative for rash.  Neurological:  Positive for dizziness and headaches.   Physical Exam Updated  Vital Signs BP (!) 116/57 (BP Location: Right Arm)   Pulse 90   Temp 97.8 F (36.6 C) (Oral)   Resp 13   Ht 5\' 4"  (1.626 m)   Wt 62.1 kg   SpO2 100%   BMI 23.52 kg/m   Physical Exam Vitals and nursing note reviewed.  Constitutional:  General: She is not in acute distress.    Appearance: She is well-developed.  HENT:     Head: Normocephalic and atraumatic.  Eyes:     Conjunctiva/sclera: Conjunctivae normal.  Cardiovascular:     Rate and Rhythm: Normal rate and regular rhythm.     Heart sounds: Normal heart sounds. No murmur heard. Pulmonary:     Effort: Pulmonary effort is normal. No respiratory distress.     Breath sounds: Normal breath sounds.  Abdominal:     Palpations: Abdomen is soft.     Tenderness: There is no abdominal tenderness.  Musculoskeletal:        General: Normal range of motion.     Cervical back: Neck supple.     Right lower leg: No tenderness. No edema.     Left lower leg: No tenderness. No edema.  Skin:    General: Skin is warm and dry.  Neurological:     General: No focal deficit present.     Mental Status: She is alert.    ED Results / Procedures / Treatments   Labs (all labs ordered are listed, but only abnormal results are displayed) Labs Reviewed  CBC WITH DIFFERENTIAL/PLATELET - Abnormal; Notable for the following components:      Result Value   WBC 12.0 (*)    Neutro Abs 8.5 (*)    Monocytes Absolute 1.1 (*)    All other components within normal limits  BASIC METABOLIC PANEL - Abnormal; Notable for the following components:   Sodium 134 (*)    Glucose, Bld 106 (*)    All other components within normal limits  D-DIMER, QUANTITATIVE  TROPONIN I (HIGH SENSITIVITY)  TROPONIN I (HIGH SENSITIVITY)    EKG EKG Interpretation  Date/Time:  Thursday November 14 2020 12:24:22 EDT Ventricular Rate:  95 PR Interval:  132 QRS Duration: 96 QT Interval:  354 QTC Calculation: 444 R Axis:   94 Text Interpretation: Normal sinus rhythm  Rightward axis Borderline ECG increased ST depression inferior than prior 8/16 Confirmed by Meridee Score [phone]) on 11/14/2020 3:25:10 PM  Radiology DG Chest 2 View  Result Date: 11/14/2020 CLINICAL DATA:  Pleuritic chest pain EXAM: CHEST - 2 VIEW COMPARISON:  CT 04/20/2019 FINDINGS: Lungs are well expanded, symmetric, and clear. No pneumothorax or pleural effusion. Cardiac size within normal limits. Pulmonary vascularity is normal. Osseous structures are age-appropriate. No acute bone abnormality. IMPRESSION: No active cardiopulmonary disease. Electronically Signed   By: Helyn Numbers MD   On: 11/14/2020 12:59    Procedures Procedures   Medications Ordered in ED Medications  acetaminophen (TYLENOL) tablet 650 mg (650 mg Oral Given 11/14/20 1615)    ED Course  I have reviewed the triage vital signs and the nursing notes.  Pertinent labs & imaging results that were available during my care of the patient were reviewed by me and considered in my medical decision making (see chart for details).  Clinical Course as of 11/15/20 1019  Thu Nov 14, 2020  1603 Abs fairly unrevealing for source of her diaphoresis and painful ribs respiration.  D-dimer negative.  Delta troponin negative.  Chest x-ray unremarkable.  Reviewed with patient.  Recommended close follow-up with her PCP. [MB]    Clinical Course User Index [MB] Terrilee Files, MD   MDM Rules/Calculators/A&P                         This patient complains of diaphoresis chest discomfort tremors headache; this  involves an extensive number of treatment Options and is a complaint that carries with it a high risk of complications and Morbidity. The differential includes ACS, pneumonia, pneumothorax, PE, vascular, GERD, musculoskeletal, dehydration, heat exhaustion  I ordered, reviewed and interpreted labs, which included CBC with mildly elevated white count nonspecific, normal chemistries, flat troponins, D-dimer negative I ordered  medication we will Tylenol for her headache I ordered imaging studies which included chest x-ray and I independently    visualized and interpreted imaging which showed No acute findins Previous records obtained and reviewed in epic, no recent admissions  After the interventions stated above, I reevaluated the patient and found patient symptoms to be improved.  She is hemodynamically stable.  Reviewed results with her and recommended close follow-up with PCP.  Return instructions discussed.   Final Clinical Impression(s) / ED Diagnoses Final diagnoses:  Nonspecific chest pain    Rx / DC Orders ED Discharge Orders     None        Terrilee Files, MD 11/15/20 1022

## 2020-11-14 NOTE — Discharge Instructions (Addendum)
You are seen in the emergency department for 2 episodes of sweats, near syncope, and aching pain in your ribs.  You had an EKG chest x-ray and blood work that did not show an obvious cause of your symptoms.  Please contact your primary care doctor for close follow-up.  Return to the emergency department if any worsening or concerning symptoms

## 2020-11-14 NOTE — ED Triage Notes (Signed)
Pt sitting outside, became diaphoretic, pain with deep breath to lower ribs.  C/o tremors.  States she has been drinking plenty of fluids.

## 2021-10-09 ENCOUNTER — Other Ambulatory Visit: Payer: Self-pay | Admitting: Internal Medicine

## 2021-10-09 ENCOUNTER — Other Ambulatory Visit (HOSPITAL_COMMUNITY): Payer: Self-pay | Admitting: Internal Medicine

## 2021-10-09 DIAGNOSIS — G459 Transient cerebral ischemic attack, unspecified: Secondary | ICD-10-CM

## 2021-10-10 ENCOUNTER — Other Ambulatory Visit (HOSPITAL_COMMUNITY): Payer: Self-pay | Admitting: Internal Medicine

## 2021-10-10 DIAGNOSIS — R519 Headache, unspecified: Secondary | ICD-10-CM

## 2021-10-10 DIAGNOSIS — Z1231 Encounter for screening mammogram for malignant neoplasm of breast: Secondary | ICD-10-CM

## 2021-10-21 ENCOUNTER — Ambulatory Visit (HOSPITAL_COMMUNITY)
Admission: RE | Admit: 2021-10-21 | Discharge: 2021-10-21 | Disposition: A | Discharge status: Home / Self Care | Payer: BC Managed Care – PPO | Source: Ambulatory Visit | Attending: Internal Medicine | Admitting: Internal Medicine

## 2021-10-21 DIAGNOSIS — G459 Transient cerebral ischemic attack, unspecified: Secondary | ICD-10-CM | POA: Diagnosis present

## 2021-11-10 ENCOUNTER — Ambulatory Visit (HOSPITAL_COMMUNITY)
Admission: RE | Admit: 2021-11-10 | Discharge: 2021-11-10 | Disposition: A | Discharge status: Home / Self Care | Payer: BC Managed Care – PPO | Source: Ambulatory Visit | Attending: Internal Medicine | Admitting: Internal Medicine

## 2021-11-10 DIAGNOSIS — R519 Headache, unspecified: Secondary | ICD-10-CM | POA: Diagnosis present

## 2023-11-07 IMAGING — CT CT HEAD W/O CM
4 series · 17 of 47 positions shown, 19 images · non-contrast
Comparison: None Available.

CLINICAL DATA: Headache



[Series 2: head w o · axial · 0.42mm/px · z∈[+65,+185]mm · 7 of 33 slices shown, 9 images]
[im 5/33  brain]
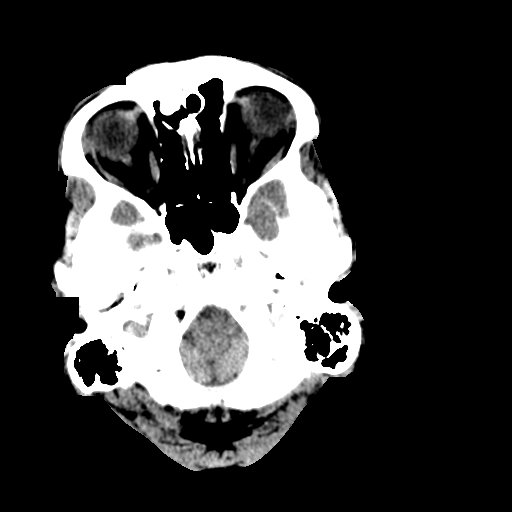
[im 5/33  bone]
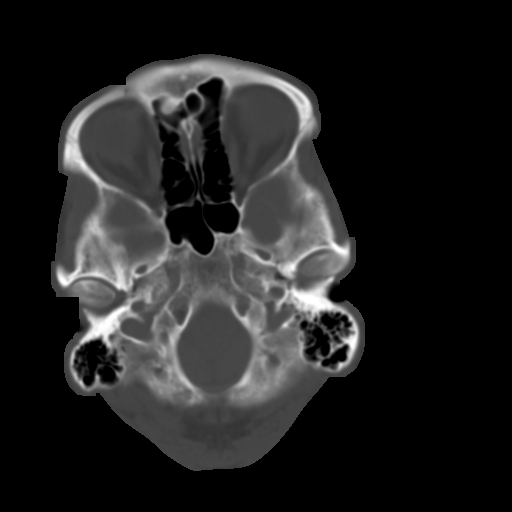
[im 9/33  brain]
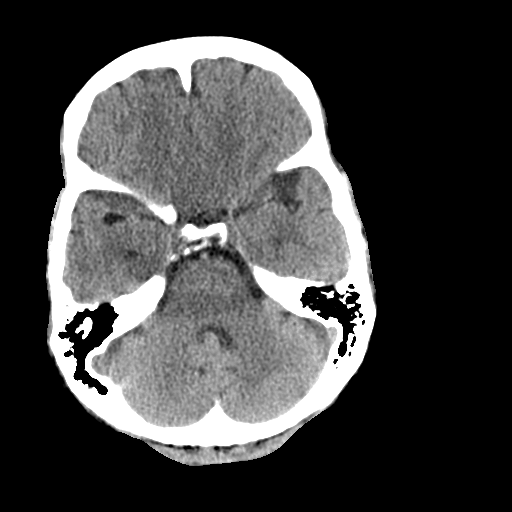
[im 13/33  brain]
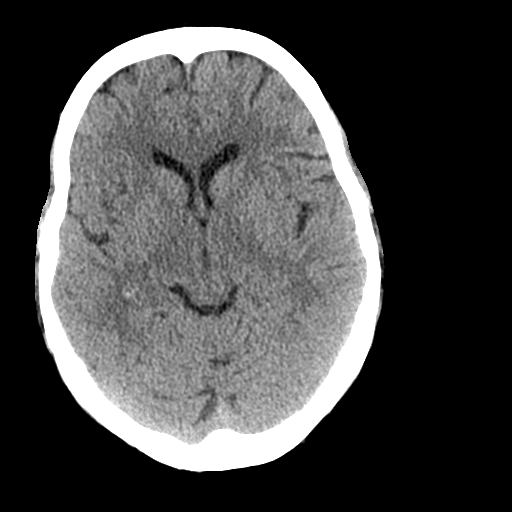
[im 17/33  brain]
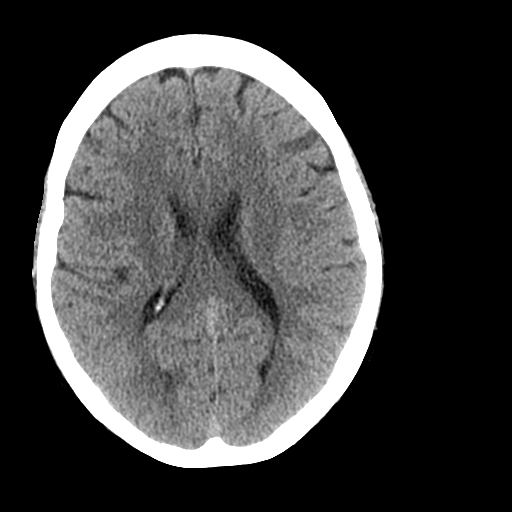
[im 21/33  brain]
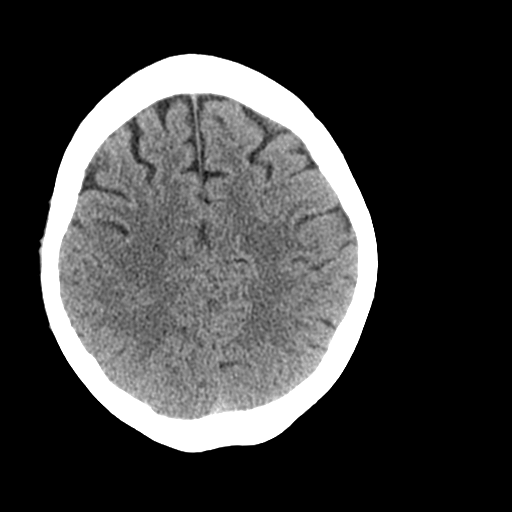
[im 21/33  bone]
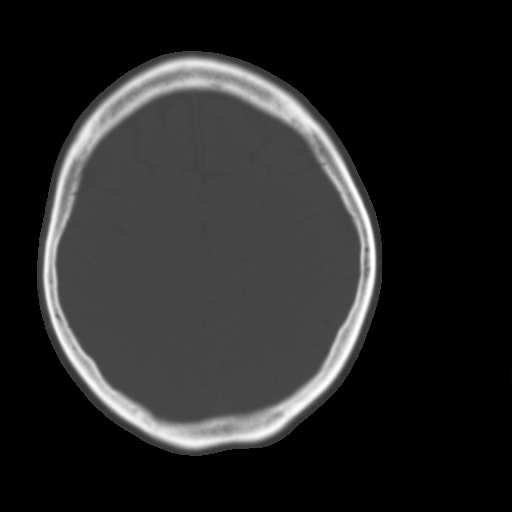
[im 25/33  brain]
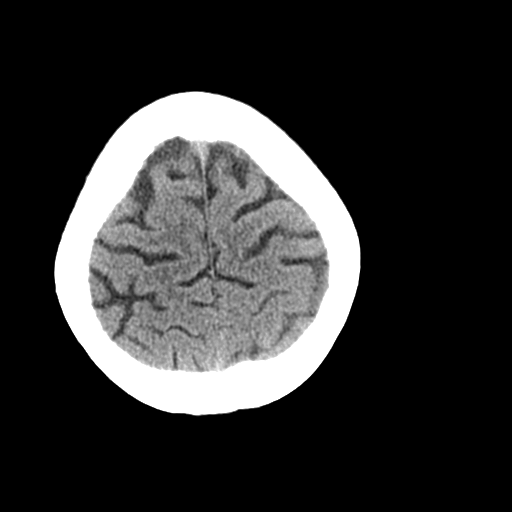
[im 29/33  brain]
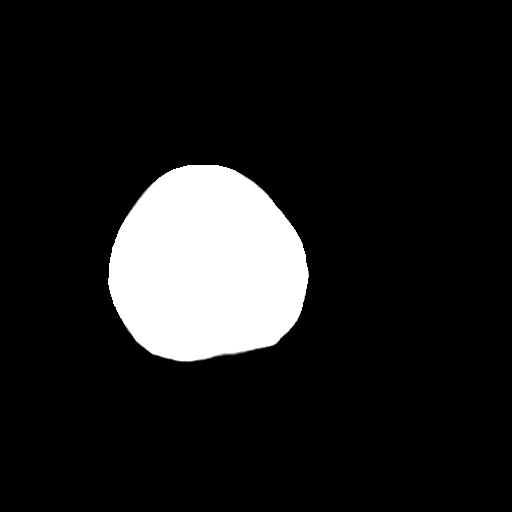

[Series 3: head bone · axial · 0.42mm/px · z∈[+61,+117]mm · 4 of 82 slices shown]
[im 9/82  bone]
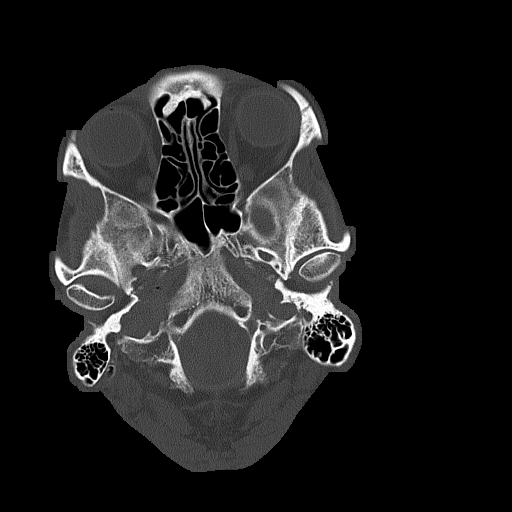
[im 17/82  bone]
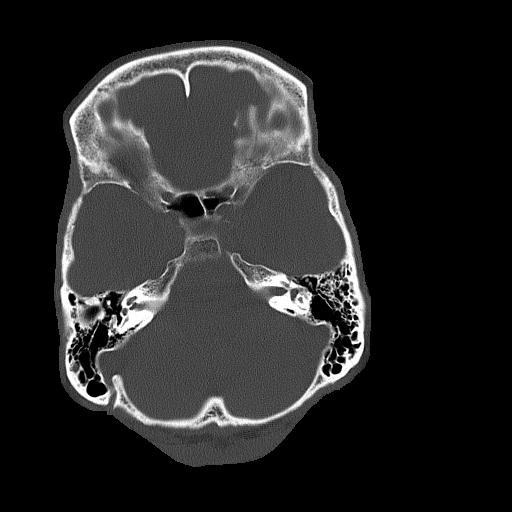
[im 25/82  bone]
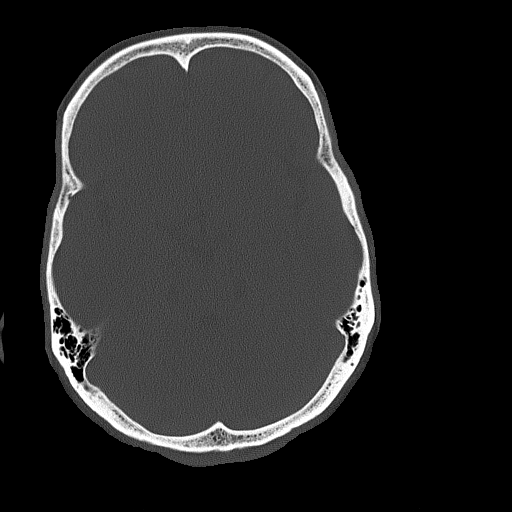
[im 37/82  bone]
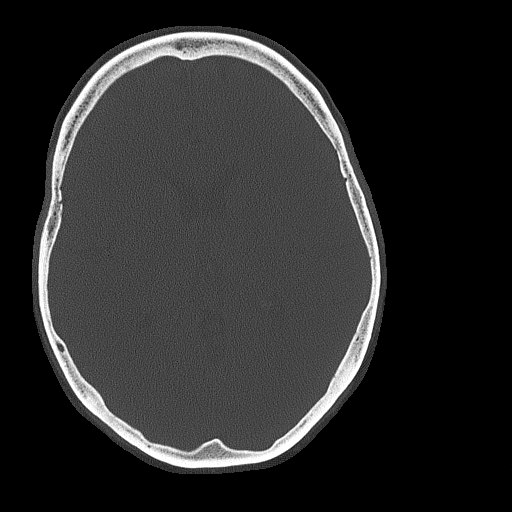

[Series 4: coronal soft · coronal · 0.40mm/px · 3 of 80 slices shown]
[im 27/80  brain]
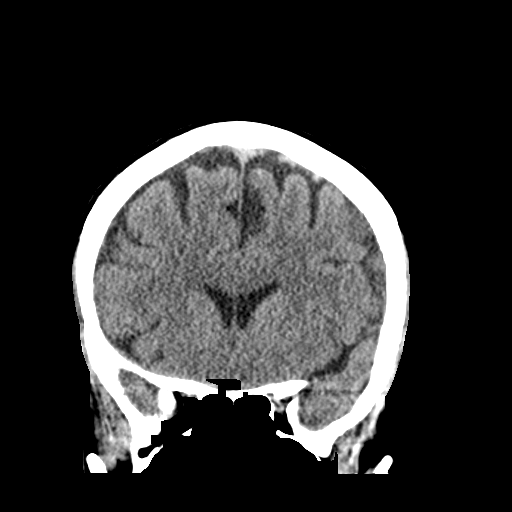
[im 36/80  brain]
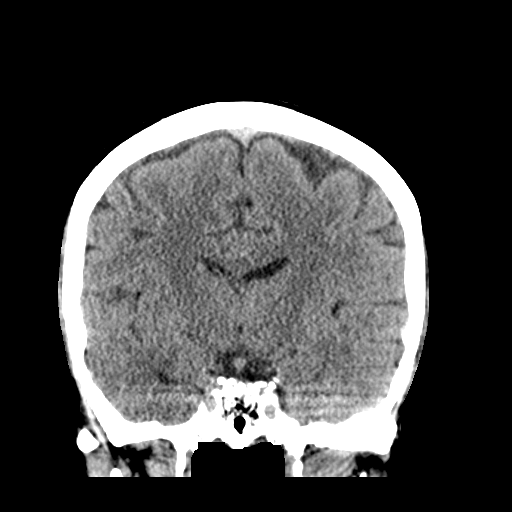
[im 44/80  brain]
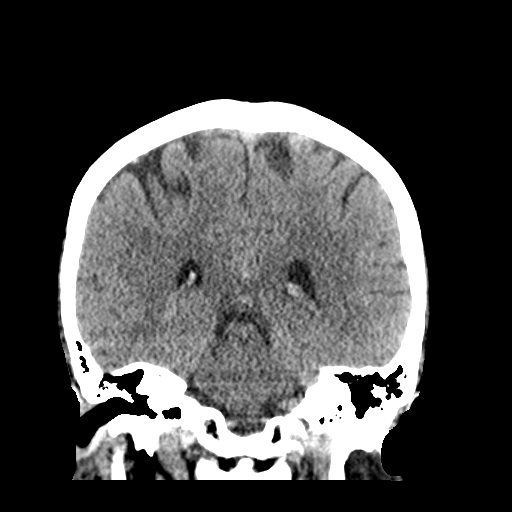

[Series 5: sagittal soft · sagittal · 0.37mm/px · 3 of 65 slices shown]
[im 22/65  brain]
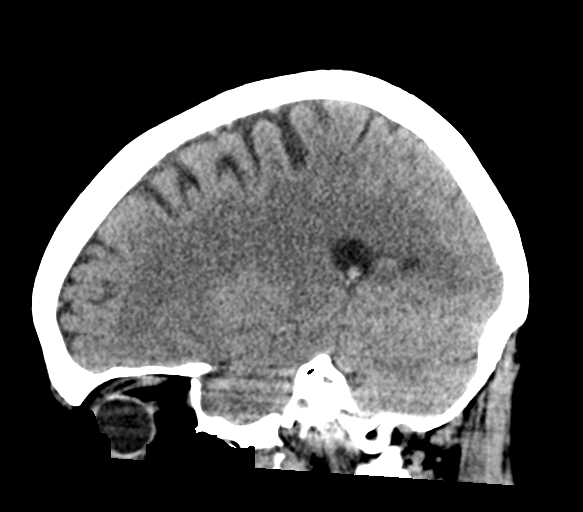
[im 33/65  brain]
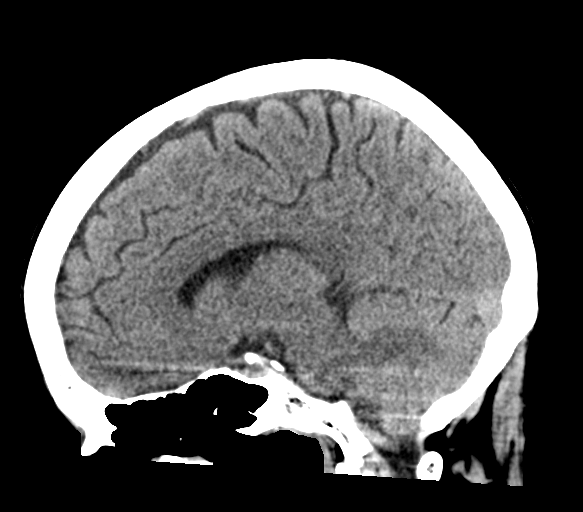
[im 43/65  brain]
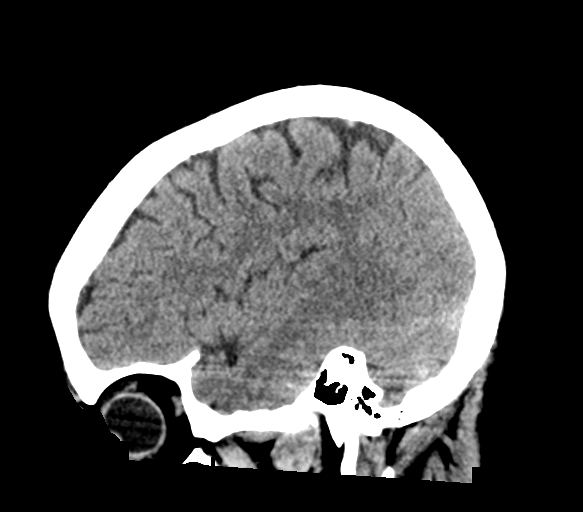

[17 of 47 positions shown; findings below may reference images not displayed]

FINDINGS: Brain: No acute intracranial hemorrhage, mass effect, or herniation.
No extra-axial fluid collections. No evidence of acute territorial
infarct. No hydrocephalus.

Vascular: No hyperdense vessel or unexpected calcification.

Skull: Normal. Negative for fracture or focal lesion.

Sinuses/Orbits: No acute finding.

Other: None.
IMPRESSION: No acute intracranial process identified.

## 2024-05-02 ENCOUNTER — Other Ambulatory Visit: Payer: Self-pay | Admitting: Internal Medicine

## 2024-05-02 ENCOUNTER — Ambulatory Visit
Admission: RE | Admit: 2024-05-02 | Discharge: 2024-05-02 | Disposition: A | Source: Ambulatory Visit | Attending: Internal Medicine | Admitting: Internal Medicine

## 2024-05-02 DIAGNOSIS — Z1231 Encounter for screening mammogram for malignant neoplasm of breast: Secondary | ICD-10-CM
